# Patient Record
Sex: Male | Born: 1965
Health system: Southern US, Community
[De-identification: ages and names within clinical notes are randomized; demographics above are authoritative.]

## PROBLEM LIST (undated history)

## (undated) DIAGNOSIS — T7840XA Allergy, unspecified, initial encounter: Secondary | ICD-10-CM

## (undated) DIAGNOSIS — F17200 Nicotine dependence, unspecified, uncomplicated: Secondary | ICD-10-CM

## (undated) DIAGNOSIS — I10 Essential (primary) hypertension: Secondary | ICD-10-CM

## (undated) DIAGNOSIS — E785 Hyperlipidemia, unspecified: Secondary | ICD-10-CM

## (undated) DIAGNOSIS — K219 Gastro-esophageal reflux disease without esophagitis: Secondary | ICD-10-CM

## (undated) HISTORY — DX: Allergy, unspecified, initial encounter: T78.40XA

## (undated) HISTORY — DX: Hyperlipidemia, unspecified: E78.5

## (undated) HISTORY — DX: Gastro-esophageal reflux disease without esophagitis: K21.9

## (undated) HISTORY — DX: Essential (primary) hypertension: I10

## (undated) HISTORY — DX: Nicotine dependence, unspecified, uncomplicated: F17.200

---

## 2005-01-30 ENCOUNTER — Emergency Department (HOSPITAL_COMMUNITY): Admission: EM | Admit: 2005-01-30 | Discharge: 2005-01-30 | Payer: Self-pay | Admitting: Emergency Medicine

## 2006-03-10 ENCOUNTER — Ambulatory Visit: Payer: Self-pay | Admitting: Family Medicine

## 2006-07-19 ENCOUNTER — Ambulatory Visit: Payer: Self-pay | Admitting: Family Medicine

## 2007-03-23 ENCOUNTER — Ambulatory Visit: Payer: Self-pay | Admitting: Family Medicine

## 2007-03-27 ENCOUNTER — Ambulatory Visit: Payer: Self-pay | Admitting: Family Medicine

## 2008-04-09 ENCOUNTER — Ambulatory Visit: Payer: Self-pay | Admitting: Family Medicine

## 2008-11-27 ENCOUNTER — Ambulatory Visit: Payer: Self-pay | Admitting: Family Medicine

## 2009-04-30 ENCOUNTER — Ambulatory Visit: Payer: Self-pay | Admitting: Family Medicine

## 2010-05-05 ENCOUNTER — Ambulatory Visit: Payer: Self-pay | Admitting: Family Medicine

## 2010-06-04 ENCOUNTER — Ambulatory Visit: Payer: Self-pay | Admitting: Family Medicine

## 2010-11-05 ENCOUNTER — Encounter: Payer: Self-pay | Admitting: Family Medicine

## 2010-11-06 ENCOUNTER — Encounter: Payer: Self-pay | Admitting: Family Medicine

## 2010-11-06 ENCOUNTER — Ambulatory Visit (INDEPENDENT_AMBULATORY_CARE_PROVIDER_SITE_OTHER): Payer: 59 | Admitting: Family Medicine

## 2010-11-06 VITALS — BP 130/90 | HR 70 | Wt 170.0 lb

## 2010-11-06 DIAGNOSIS — K219 Gastro-esophageal reflux disease without esophagitis: Secondary | ICD-10-CM

## 2010-11-06 DIAGNOSIS — E785 Hyperlipidemia, unspecified: Secondary | ICD-10-CM

## 2010-11-06 LAB — LIPID PANEL
Cholesterol: 176 mg/dL (ref 0–200)
HDL: 39 mg/dL — ABNORMAL LOW (ref >39)
LDL Cholesterol: 73 mg/dL (ref 0–99)
Total CHOL/HDL Ratio: 4.5 Ratio
Triglycerides: 321 mg/dL — ABNORMAL HIGH (ref <150)
VLDL: 64 mg/dL — ABNORMAL HIGH (ref 0–40)

## 2010-11-06 NOTE — Progress Notes (Signed)
  Subjective:    Patient ID: Michael [name], male    DOB: 1965-07-29, 45 y.o.   MRN: [id number]  HPI he is here for recheck on his cholesterol. He is also having difficulty with reflux disease. He is taking Pepcid 10 mg. He does note that certain foods make this worse. Symptoms do not occur regularly. They're usually associated with spicy foods.   Review of Systems     Objective:   Physical Exam dirt and in no distress otherwise not examined        Assessment & Plan:  GERD. Commend he increase Pepcid to 2 pills per day and he is symptomatic and if further difficulty call me. Use Pepcid prior to eating anything he knows will cause symptoms

## 2010-11-06 NOTE — Patient Instructions (Signed)
Use to Pepcid prior TED anything you know will make you have indigestion. Continue on Lipitor. Keep working on quitting smoking.

## 2010-11-16 ENCOUNTER — Telehealth: Payer: Self-pay

## 2010-11-16 NOTE — Telephone Encounter (Signed)
Called pt tell him labs not so good going to send diet info in mail pt said ok meds called into [facility] [address]

## 2011-05-06 ENCOUNTER — Other Ambulatory Visit: Payer: Self-pay | Admitting: Family Medicine

## 2011-05-07 NOTE — Telephone Encounter (Signed)
Is this okay?

## 2011-05-07 NOTE — Telephone Encounter (Signed)
I gave him a one-month supply. He needs an appointment.

## 2011-05-07 NOTE — Telephone Encounter (Signed)
Left message to call back  

## 2011-05-07 NOTE — Telephone Encounter (Signed)
Pt notified and is coming in November 20th for physical, and refill on med

## 2011-05-18 ENCOUNTER — Encounter: Payer: Self-pay | Admitting: Family Medicine

## 2011-05-18 ENCOUNTER — Ambulatory Visit (INDEPENDENT_AMBULATORY_CARE_PROVIDER_SITE_OTHER): Payer: 59 | Admitting: Family Medicine

## 2011-05-18 DIAGNOSIS — K219 Gastro-esophageal reflux disease without esophagitis: Secondary | ICD-10-CM

## 2011-05-18 DIAGNOSIS — R209 Unspecified disturbances of skin sensation: Secondary | ICD-10-CM

## 2011-05-18 DIAGNOSIS — E785 Hyperlipidemia, unspecified: Secondary | ICD-10-CM

## 2011-05-18 DIAGNOSIS — Z0000 Encounter for general adult medical examination without abnormal findings: Secondary | ICD-10-CM

## 2011-05-18 DIAGNOSIS — R208 Other disturbances of skin sensation: Secondary | ICD-10-CM

## 2011-05-18 DIAGNOSIS — F17200 Nicotine dependence, unspecified, uncomplicated: Secondary | ICD-10-CM | POA: Insufficient documentation

## 2011-05-18 DIAGNOSIS — I10 Essential (primary) hypertension: Secondary | ICD-10-CM

## 2011-05-18 DIAGNOSIS — Z23 Encounter for immunization: Secondary | ICD-10-CM

## 2011-05-18 DIAGNOSIS — J301 Allergic rhinitis due to pollen: Secondary | ICD-10-CM

## 2011-05-18 LAB — POCT URINALYSIS DIPSTICK
Bilirubin, UA: NEGATIVE
Blood, UA: NEGATIVE
Glucose, UA: NEGATIVE
Ketones, UA: NEGATIVE
Leukocytes, UA: NEGATIVE
Nitrite, UA: NEGATIVE
Protein, UA: NEGATIVE
Spec Grav, UA: 1.015
Urobilinogen, UA: NEGATIVE
pH, UA: 5

## 2011-05-18 LAB — COMPREHENSIVE METABOLIC PANEL
ALT: 26 U/L (ref 0–53)
AST: 20 U/L (ref 0–37)
Albumin: 4.8 g/dL (ref 3.5–5.2)
Alkaline Phosphatase: 87 U/L (ref 39–117)
BUN: 15 mg/dL (ref 6–23)
CO2: 28 mEq/L (ref 19–32)
Calcium: 9.9 mg/dL (ref 8.4–10.5)
Chloride: 103 mEq/L (ref 96–112)
Creat: 0.92 mg/dL (ref 0.50–1.35)
Glucose, Bld: 86 mg/dL (ref 70–99)
Potassium: 4.2 mEq/L (ref 3.5–5.3)
Sodium: 139 mEq/L (ref 135–145)
Total Bilirubin: 1 mg/dL (ref 0.3–1.2)
Total Protein: 7.2 g/dL (ref 6.0–8.3)

## 2011-05-18 LAB — LIPID PANEL
Cholesterol: 151 mg/dL (ref 0–200)
HDL: 41 mg/dL (ref >39)
LDL Cholesterol: 51 mg/dL (ref 0–99)
Total CHOL/HDL Ratio: 3.7 Ratio
Triglycerides: 296 mg/dL — ABNORMAL HIGH (ref <150)
VLDL: 59 mg/dL — ABNORMAL HIGH (ref 0–40)

## 2011-05-18 LAB — POC HEMOCCULT BLD/STL (OFFICE/1-CARD/DIAGNOSTIC): Fecal Occult Blood, POC: NEGATIVE

## 2011-05-18 MED ORDER — BISOPROLOL-HYDROCHLOROTHIAZIDE 10-6.25 MG PO TABS: 1.0000 | ORAL_TABLET | Freq: Every day | ORAL | Status: DC

## 2011-05-18 MED ORDER — ATORVASTATIN CALCIUM 20 MG PO TABS: 20.0000 mg | ORAL_TABLET | Freq: Every day | ORAL | Status: DC

## 2011-05-18 NOTE — Patient Instructions (Signed)
We will let you know the results of the nerve study. Also keep track of your wrist position in relation to when you have the tingling sensation. We will work with you when you're ready to quit smoking

## 2011-05-18 NOTE — Progress Notes (Signed)
Subjective:    Patient ID: Michael [name], male    DOB: 04/25/1966, 45 y.o.   MRN: [id number]  HPI He is here for complete examination. He has had difficulty with bilateral wrist tingling. He relates this to using the mouse at work and also when he is using a drill. He states that it affects all fingers. He also complains of intermittent knee pain and a popping sensation. The pain goes away relatively quickly. No locking grinding or effusion. He also complains of intermittent dizziness. He presently is on blood pressure medication. He continues to smoke and is not interested in quitting at this time. Social and family history was reviewed. He is also noted some change in his vision and knows he needs glasses but at this time is not interested. His allergies are under good control. He uses Pepcid for his reflux. Continues on his Lipitor.   Review of Systems  Constitutional: Negative.   HENT: Negative.   Eyes: Positive for visual disturbance.  Respiratory: Negative.   Cardiovascular: Negative.   Gastrointestinal: Positive for nausea.  Genitourinary: Negative.   Skin: Negative.   Psychiatric/Behavioral: Negative.        Objective:   Physical Exam BP 132/92  Pulse 62  Ht 5\' 7"  (1.702 m)  Wt 168 lb (76.204 kg)  BMI 26.31 kg/m2  General Appearance:    Alert, cooperative, no distress, appears stated age  Head:    Normocephalic, without obvious abnormality, atraumatic  Eyes:    PERRL, conjunctiva/corneas clear, EOM's intact, fundi    benign  Ears:    Normal TM's and external ear canals  Nose:   Nares normal, mucosa normal, no drainage or sinus   tenderness  Throat:   Lips, mucosa, and tongue normal; teeth and gums normal  Neck:   Supple, no lymphadenopathy;  thyroid:  no   enlargement/tenderness/nodules; no carotid   bruit or JVD  Back:    Spine nontender, no curvature, ROM normal, no CVA     tenderness  Lungs:     Clear to auscultation bilaterally without wheezes, rales or     ronchi;  respirations unlabored  Chest Wall:    No tenderness or deformity   Heart:    Regular rate and rhythm, S1 and S2 normal, no murmur, rub   or gallop  Breast Exam:    No chest wall tenderness, masses or gynecomastia  Abdomen:     Soft, non-tender, nondistended, normoactive bowel sounds,    no masses, no hepatosplenomegaly  Genitalia:    Normal male external genitalia without lesions.  Testicles without masses.  No inguinal hernias.  Rectal:    Normal sphincter tone, no masses or tenderness; guaiac negative stool.  Prostate smooth, no nodules, not enlarged.  Extremities:   No clubbing, cyanosis or edema.negative Tinel and Phalen's tests of wrist  Pulses:   2+ and symmetric all extremities  Skin:   Skin color, texture, turgor normal, no rashes or lesions  Lymph nodes:   Cervical, supraclavicular, and axillary nodes normal  Neurologic:   CNII-XII intact, normal strength, sensation and gait; reflexes 2+ and symmetric throughout          Psych:   Normal mood, affect, hygiene and grooming.           Assessment & Plan:   1. Physical exam, annual  Visual acuity screening, POCT Urinalysis Dipstick, POCT Hemoccult (POC) Blood/Stool Test  2. Need for prophylactic vaccination and inoculation against influenza  Flu vaccine greater than or equal  to 3yo preservative free IM  3. Dysesthesia  Nerve conduction test  4. Current smoker    5. Hypertension    6. Hyperlipidemia LDL goal < 100    7. GERD (gastroesophageal reflux disease)    8. Allergic rhinitis due to pollen    his medications were renewed. I will work with him and he is ready to quit smoking. He'll continue on his Pepcid. Flu shot was given with discussion of possible side effects and benefits

## 2011-05-19 LAB — CBC WITH DIFFERENTIAL/PLATELET
Basophils Absolute: 0.1 10*3/uL (ref 0.0–0.1)
Basophils Relative: 1 % (ref 0–1)
Eosinophils Absolute: 0.2 10*3/uL (ref 0.0–0.7)
Eosinophils Relative: 3 % (ref 0–5)
HCT: 48.5 % (ref 39.0–52.0)
Hemoglobin: 16.3 g/dL (ref 13.0–17.0)
Lymphocytes Relative: 20 % (ref 12–46)
Lymphs Abs: 1.5 10*3/uL (ref 0.7–4.0)
MCH: 31.8 pg (ref 26.0–34.0)
MCHC: 33.6 g/dL (ref 30.0–36.0)
MCV: 94.5 fL (ref 78.0–100.0)
Monocytes Absolute: 0.4 10*3/uL (ref 0.1–1.0)
Monocytes Relative: 6 % (ref 3–12)
Neutro Abs: 5.1 10*3/uL (ref 1.7–7.7)
Neutrophils Relative %: 70 % (ref 43–77)
Platelets: 196 10*3/uL (ref 150–400)
RBC: 5.13 MIL/uL (ref 4.22–5.81)
RDW: 13.1 % (ref 11.5–15.5)
WBC: 7.2 10*3/uL (ref 4.0–10.5)

## 2011-08-20 ENCOUNTER — Encounter: Payer: Self-pay | Admitting: Medical

## 2011-08-20 ENCOUNTER — Ambulatory Visit (INDEPENDENT_AMBULATORY_CARE_PROVIDER_SITE_OTHER): Payer: 59 | Admitting: Medical

## 2011-08-20 VITALS — BP 130/80 | HR 60 | Temp 97.8°F | Resp 16 | Wt 166.0 lb

## 2011-08-20 DIAGNOSIS — R42 Dizziness and giddiness: Secondary | ICD-10-CM

## 2011-08-20 DIAGNOSIS — R5381 Other malaise: Secondary | ICD-10-CM

## 2011-08-20 DIAGNOSIS — R531 Weakness: Secondary | ICD-10-CM

## 2011-08-20 DIAGNOSIS — R0602 Shortness of breath: Secondary | ICD-10-CM

## 2011-08-20 DIAGNOSIS — R109 Unspecified abdominal pain: Secondary | ICD-10-CM

## 2011-08-20 DIAGNOSIS — R5383 Other fatigue: Secondary | ICD-10-CM

## 2011-08-20 LAB — BASIC METABOLIC PANEL
BUN: 23 mg/dL (ref 6–23)
CO2: 25 mEq/L (ref 19–32)
Calcium: 9.8 mg/dL (ref 8.4–10.5)
Chloride: 103 mEq/L (ref 96–112)
Creat: 0.98 mg/dL (ref 0.50–1.35)
Glucose, Bld: 90 mg/dL (ref 70–99)
Potassium: 4.1 mEq/L (ref 3.5–5.3)
Sodium: 136 mEq/L (ref 135–145)

## 2011-08-20 LAB — TSH: TSH: 1.965 u[IU]/mL (ref 0.350–4.500)

## 2011-08-20 MED ORDER — DEXLANSOPRAZOLE 60 MG PO CPDR: 60.0000 mg | DELAYED_RELEASE_CAPSULE | Freq: Every day | ORAL | Status: DC

## 2011-08-20 NOTE — Progress Notes (Signed)
Subjective:   HPI  Michael [name] is a 46 y.o. male who presents for multiple c/o.  Symptoms began yesterday morning, felt like he was having stomach pain.  After breakfast felt like he could not breath right.  Had some chest discomfort and shortness of breath lasted for 1 hour, and felt weak, tired, and dizzy for 3 hours. Had cold clammy feeling on his neck.  Denies heartburn, nausea, vomiting, constipation, no difficulty swallowing.  SOB was worse with activity though. Took Maalox and this helped with the fullness, was able to burp and relieve some pain.  Still has some dyspnea today, feels weak and out of breath.  Denies palpitations or sweats.   He drinks coffee, but no caffeine yesterday.  He does have hx/o HTN and hyperlipidemia.  Smokes 8-10 cigarettes daily.  He notes intermittent "woozy" feeling for the past 6 months.  He notes that the Bisoprolol/HCT was doubled last year, and thinks this may have something to do with these symptoms.  Gets weakness occasionally over the last 6 months.   No other aggravating or relieving factors.   He notes grandfather had MI and felt weak before his MI and he is worried about this.  No other family hx/o heart disease.    The following portions of the patient's history were reviewed and updated as appropriate: allergies, current medications, past family history, past medical history, past social history, past surgical history and problem list.  Past Medical History  Diagnosis Date  . Hyperlipidemia   . Allergy   . GERD (gastroesophageal reflux disease)   . Hypertension     No Known Allergies   Review of Systems Gen: no weight changes, no fever, chills, +sweats Skin: no rash Heent: negative Heart: no CP, palpitations, edema Lungs: +SOB, DOE, no orthopnea, PND GI: +abdominal pain, fullness, no NVD, no blood in stool GU: negative Neuro: dizzy, but no numbness, tingling, weakness      Objective:   Physical Exam  General appearance: alert, no  distress, WD/WN HEENT: normocephalic, sclerae anicteric, TMs pearly, nares patent, no discharge or erythema, pharynx normal Oral cavity: MMM, no lesions Neck: supple, no lymphadenopathy, no thyromegaly, no masses Heart: RRR, normal S1, S2, no murmurs Lungs: CTA bilaterally, no wheezes, rhonchi, or rales Abdomen: +bs, soft, mild epigastric tenderness, otherwise, non distended, no masses, no hepatomegaly, no splenomegaly Pulses: 2+ symmetric, upper and lower extremities, normal cap refill Ext: no edema, cyanosis, clubbing   Adult ECG Report  Indication: DOE, SOB  Rate: 56 bpm  Rhythm: sinus bradycardia, on beta blocker  QRS Axis: 47 degrees  PR Interval: 166 ms  QRS Duration: 90ms  QTc: 368 ms  Conduction Disturbances: none  Other Abnormalities: biphasic p wave V1, T wave depression III, flat T wave V1  Patient's cardiac risk factors are: dyslipidemia, hypertension, male gender and smoking/ tobacco exposure.  EKG comparison: 12/2004  Narrative Interpretation: sinus bradycardia, no significant change from 2006 EKG other than bradycardia  Chest xray:  Borderline cardiac silhouette enlargement, otherwise unremarkable CXR.  No mass, obvious CHF.  Will send for overead.   Assessment and Plan :     Encounter Diagnoses  Name Primary?  . SOB (shortness of breath) Yes  . Weakness   . Dizziness   . Fatigue   . Abdominal discomfort    Discussed his symptoms, risk factors for heart disease, and workup.  At this point etiology is unclear, and could be GERD related, cardiac related?  I will have him try  samples of Dexilant over the weekend, avoid GERD triggers, rest, no strenuous activity this weekend.  We will get labs today, send CXR for overread, and pending labs, will likely get cardiology consult for completeness and rule out cardiac origin.  Discussed signs/symptoms of acute coronary syndrome or worrisome symptoms that would prompt 911 call.  He understands and agrees with plan.  I will call  him tonight or tomorrow pending labs.

## 2011-08-21 LAB — CK TOTAL AND CKMB (NOT AT ARMC)
CK, MB: 0.8 ng/mL (ref 0.3–4.0)
Total CK: 89 U/L (ref 7–232)

## 2011-08-21 LAB — TROPONIN I: Troponin I: <0.01 ng/mL (ref <0.06)

## 2011-08-21 LAB — BRAIN NATRIURETIC PEPTIDE: Brain Natriuretic Peptide: 20.1 pg/mL (ref 0.0–100.0)

## 2011-08-27 ENCOUNTER — Telehealth: Payer: Self-pay | Admitting: Family Medicine

## 2011-08-27 ENCOUNTER — Encounter: Payer: Self-pay | Admitting: Medical

## 2011-08-27 NOTE — Telephone Encounter (Signed)
Message copied by Janeice Robinson on Fri Aug 27, 2011  9:14 AM ------      Message from: Aleen Campi, DAVID S      Created: Thu Aug 26, 2011 10:47 AM       CXR over read normal.  F/u as planned.

## 2011-08-27 NOTE — Telephone Encounter (Signed)
[facility] NOTIFYING THE PATIENT THAT HIS XRAY WAS NORMAL AND TO F/U WITH SHANE PA-C. CLS

## 2012-05-03 ENCOUNTER — Telehealth: Payer: Self-pay | Admitting: Family Medicine

## 2012-05-03 MED ORDER — ATORVASTATIN CALCIUM 20 MG PO TABS: 20.0000 mg | ORAL_TABLET | Freq: Every day | ORAL | Status: DC

## 2012-05-03 NOTE — Telephone Encounter (Signed)
Lipitor called in.

## 2012-05-10 ENCOUNTER — Encounter: Payer: Self-pay | Admitting: Internal Medicine

## 2012-05-22 ENCOUNTER — Encounter: Payer: Self-pay | Admitting: Family Medicine

## 2012-05-22 ENCOUNTER — Ambulatory Visit (INDEPENDENT_AMBULATORY_CARE_PROVIDER_SITE_OTHER): Payer: 59 | Admitting: Family Medicine

## 2012-05-22 VITALS — BP 126/80 | HR 74 | Ht 67.0 in | Wt 165.0 lb

## 2012-05-22 DIAGNOSIS — K219 Gastro-esophageal reflux disease without esophagitis: Secondary | ICD-10-CM

## 2012-05-22 DIAGNOSIS — F17200 Nicotine dependence, unspecified, uncomplicated: Secondary | ICD-10-CM

## 2012-05-22 DIAGNOSIS — Z0000 Encounter for general adult medical examination without abnormal findings: Secondary | ICD-10-CM

## 2012-05-22 DIAGNOSIS — I10 Essential (primary) hypertension: Secondary | ICD-10-CM

## 2012-05-22 DIAGNOSIS — Z23 Encounter for immunization: Secondary | ICD-10-CM

## 2012-05-22 DIAGNOSIS — E785 Hyperlipidemia, unspecified: Secondary | ICD-10-CM

## 2012-05-22 DIAGNOSIS — J301 Allergic rhinitis due to pollen: Secondary | ICD-10-CM

## 2012-05-22 LAB — POCT URINALYSIS DIPSTICK
Bilirubin, UA: NEGATIVE
Blood, UA: NEGATIVE
Glucose, UA: NEGATIVE
Ketones, UA: NEGATIVE
Leukocytes, UA: NEGATIVE
Nitrite, UA: NEGATIVE
Protein, UA: NEGATIVE
Spec Grav, UA: 1.02
Urobilinogen, UA: NEGATIVE
pH, UA: 5

## 2012-05-22 MED ORDER — BISOPROLOL-HYDROCHLOROTHIAZIDE 10-6.25 MG PO TABS: 1.0000 | ORAL_TABLET | Freq: Every day | ORAL | Status: DC

## 2012-05-22 MED ORDER — ATORVASTATIN CALCIUM 20 MG PO TABS: 20.0000 mg | ORAL_TABLET | Freq: Every day | ORAL | Status: DC

## 2012-05-22 NOTE — Progress Notes (Signed)
Subjective:    Patient ID: Michael [name], male    DOB: [birth year], 46 y.o.   MRN: [id number]  HPI Here for complete examination. He does have various complaints but none of any major significance. He continues to smoke and is not ready to quit. He continues on his blood pressure medication, reflux meds and statin and is having no difficulty with these. His work is going well. Home life is somewhat stressful but he seems to be handling this fairly well. Social and family history were reviewed.   Review of Systems  Constitutional: Negative.   HENT: Negative.   Eyes: Negative.   Respiratory: Negative.   Cardiovascular: Negative.   Gastrointestinal: Negative.   Genitourinary: Negative.   Musculoskeletal: Negative.   Skin: Negative.   Neurological: Negative.   Hematological: Negative.   Psychiatric/Behavioral: Negative.        Objective:   Physical Exam BP 126/80  Pulse 74  Ht 5\' 7"  (1.702 m)  Wt 165 lb (74.844 kg)  BMI 25.84 kg/m2  SpO2 99%  General Appearance:    Alert, cooperative, no distress, appears stated age  Head:    Normocephalic, without obvious abnormality, atraumatic  Eyes:    PERRL, conjunctiva/corneas clear, EOM's intact, fundi    benign  Ears:    Normal TM's and external ear canals  Nose:   Nares normal, mucosa normal, no drainage or sinus   tenderness  Throat:   Lips, mucosa, and tongue normal; teeth and gums normal  Neck:   Supple, no lymphadenopathy;  thyroid:  no   enlargement/tenderness/nodules; no carotid   bruit or JVD  Back:    Spine nontender, no curvature, ROM normal, no CVA     tenderness  Lungs:     Clear to auscultation bilaterally without wheezes, rales or     ronchi; respirations unlabored  Chest Wall:    No tenderness or deformity   Heart:    Regular rate and rhythm, S1 and S2 normal, no murmur, rub   or gallop  Breast Exam:    No chest wall tenderness, masses or gynecomastia  Abdomen:     Soft, non-tender, nondistended, normoactive bowel  sounds,    no masses, no hepatosplenomegaly  Genitalia:    Normal male external genitalia without lesions.  Testicles without masses.  No inguinal hernias.  Rectal:    Normal sphincter tone, no masses or tenderness; guaiac negative stool.  Prostate smooth, no nodules, not enlarged.  Extremities:   No clubbing, cyanosis or edema  Pulses:   2+ and symmetric all extremities  Skin:   Skin color, texture, turgor normal, no rashes or lesions  Lymph nodes:   Cervical, supraclavicular, and axillary nodes normal  Neurologic:   CNII-XII intact, normal strength, sensation and gait; reflexes 2+ and symmetric throughout          Psych:   Normal mood, affect, hygiene and grooming.          Assessment & Plan:   1. Routine general medical examination at a health care facility  POCT Urinalysis Dipstick, Lipid panel, CBC with Differential, Comprehensive metabolic panel  2. Need for prophylactic vaccination and inoculation against influenza  Flu vaccine greater than or equal to 3yo preservative free IM  3. Smoker    4. Hypertension  bisoprolol-hydrochlorothiazide (ZIAC) 10-6.25 MG per tablet  5. Hyperlipidemia LDL goal < 100  atorvastatin (LIPITOR) 20 MG tablet, Lipid panel  6. GERD (gastroesophageal reflux disease)    7. Allergic rhinitis due to pollen  flu shot given with discussion of risks and benefits I encouraged him to take care of himself and when he is ready to quit, call me.

## 2012-05-23 LAB — CBC WITH DIFFERENTIAL/PLATELET
Basophils Absolute: 0.1 10*3/uL (ref 0.0–0.1)
Basophils Relative: 1 % (ref 0–1)
Eosinophils Absolute: 0.2 10*3/uL (ref 0.0–0.7)
Eosinophils Relative: 2 % (ref 0–5)
HCT: 47.2 % (ref 39.0–52.0)
Hemoglobin: 16.2 g/dL (ref 13.0–17.0)
Lymphocytes Relative: 24 % (ref 12–46)
Lymphs Abs: 1.8 10*3/uL (ref 0.7–4.0)
MCH: 31.3 pg (ref 26.0–34.0)
MCHC: 34.3 g/dL (ref 30.0–36.0)
MCV: 91.1 fL (ref 78.0–100.0)
Monocytes Absolute: 0.4 10*3/uL (ref 0.1–1.0)
Monocytes Relative: 5 % (ref 3–12)
Neutro Abs: 5 10*3/uL (ref 1.7–7.7)
Neutrophils Relative %: 68 % (ref 43–77)
Platelets: 192 10*3/uL (ref 150–400)
RBC: 5.18 MIL/uL (ref 4.22–5.81)
RDW: 12.9 % (ref 11.5–15.5)
WBC: 7.4 10*3/uL (ref 4.0–10.5)

## 2012-05-23 LAB — COMPREHENSIVE METABOLIC PANEL
ALT: 25 U/L (ref 0–53)
AST: 20 U/L (ref 0–37)
Albumin: 5 g/dL (ref 3.5–5.2)
Alkaline Phosphatase: 79 U/L (ref 39–117)
BUN: 20 mg/dL (ref 6–23)
CO2: 31 mEq/L (ref 19–32)
Calcium: 10 mg/dL (ref 8.4–10.5)
Chloride: 102 mEq/L (ref 96–112)
Creat: 0.91 mg/dL (ref 0.50–1.35)
Glucose, Bld: 88 mg/dL (ref 70–99)
Potassium: 4.5 mEq/L (ref 3.5–5.3)
Sodium: 139 mEq/L (ref 135–145)
Total Bilirubin: 0.7 mg/dL (ref 0.3–1.2)
Total Protein: 7.3 g/dL (ref 6.0–8.3)

## 2012-05-23 LAB — LIPID PANEL
Cholesterol: 136 mg/dL (ref 0–200)
HDL: 42 mg/dL (ref >39)
LDL Cholesterol: 48 mg/dL (ref 0–99)
Total CHOL/HDL Ratio: 3.2 Ratio
Triglycerides: 230 mg/dL — ABNORMAL HIGH (ref <150)
VLDL: 46 mg/dL — ABNORMAL HIGH (ref 0–40)

## 2012-05-23 NOTE — Progress Notes (Signed)
Quick Note:  Called pt cell # and home # left message labs okay continue present meds ______

## 2012-06-01 ENCOUNTER — Ambulatory Visit (INDEPENDENT_AMBULATORY_CARE_PROVIDER_SITE_OTHER): Payer: BC Managed Care – PPO | Admitting: Family Medicine

## 2012-06-01 ENCOUNTER — Encounter: Payer: Self-pay | Admitting: Family Medicine

## 2012-06-01 VITALS — BP 108/80 | HR 60 | Temp 98.2°F | Resp 16 | Wt 166.0 lb

## 2012-06-01 DIAGNOSIS — M461 Sacroiliitis, not elsewhere classified: Secondary | ICD-10-CM

## 2012-06-01 NOTE — Progress Notes (Signed)
  Subjective:    Patient ID: Michael [name], male    DOB: 07/23/1965, 46 y.o.   MRN: [id number]  HPI He noted the onset of back pain on November 25. No history of injury,numbness,tingling,weakness. It has interfered with his ability to lift items at work.  Review of Systems     Objective:   Physical Exam Alert and in no distress. Slight tenderness to palpation in the left SI joint area. Pearlean Brownie testing is positive. Stork test was negative. Negative straight leg raising with normal DTRs.      Assessment & Plan:   1. Sacroiliitis    recommend heat, stretching, anti-inflammatory of choice. Discussed proper lifting technique with him. Call if further problems.

## 2012-06-01 NOTE — Patient Instructions (Addendum)
Heat for 20 minutes 3 times per day. After you are done with that then do some stretching. Knees to chest, one side than the other than both and then rotate. Proper lifting includes keeping your back vertical If not better in a couple weeks come on back

## 2012-07-10 ENCOUNTER — Other Ambulatory Visit: Payer: Self-pay | Admitting: Family Medicine

## 2012-08-15 ENCOUNTER — Ambulatory Visit (INDEPENDENT_AMBULATORY_CARE_PROVIDER_SITE_OTHER): Payer: BC Managed Care – PPO | Admitting: Family Medicine

## 2012-08-15 ENCOUNTER — Encounter: Payer: Self-pay | Admitting: Family Medicine

## 2012-08-15 VITALS — BP 130/86 | HR 70 | Wt 168.0 lb

## 2012-08-15 DIAGNOSIS — M25519 Pain in unspecified shoulder: Secondary | ICD-10-CM

## 2012-08-15 DIAGNOSIS — M25512 Pain in left shoulder: Secondary | ICD-10-CM

## 2012-08-15 NOTE — Patient Instructions (Addendum)
Heat for 20 minutes 3 times per day. Ibuprofen 800 mg 3 times per day. Pay attention to what things make your back and shoulder better or worse. Come back in a week if you're still having trouble we'll reevaluate

## 2012-08-15 NOTE — Progress Notes (Signed)
  Subjective:    Patient ID: Fayrene Fearing A Hubers, male    DOB: 03/08/1966, 47 y.o.   MRN: [id number]  HPI  Approximately 3 weeks ago he noted some left shoulder pain in the subscapular area. The next day or so he then noted pain going down his arm to his elbow. He notes that neck motion does make the shoulder area worse but does not radiate down his arm. No numbness, tingling or weakness. He has been using heat on the area as many as 20 times per day as well as 400 mg ibuprofen every 6 hours. He also complains of a two-day history of left anterior chest soreness but is made worse with physical activity   Review of Systems     Objective:   Physical Exam Alert and in no distress. Left shoulder exam shows full motion without pain. No laxity noted. Normal sensation and strength. DTRs normal. Full motion of the neck. Posterior flexion of the neck did cause some scapular discomfort. No tenderness to palpation in the scapular area with normal motion.       Assessment & Plan:  Left shoulder pain I will treat conservatively with heat for 20 minutes 3 times per day. Also recommend ibuprofen 800 mg 3 times a day. He is to keep track of symptoms that make this better and worse and return here for reevaluation if no improvement.

## 2012-10-05 ENCOUNTER — Ambulatory Visit (INDEPENDENT_AMBULATORY_CARE_PROVIDER_SITE_OTHER): Payer: BC Managed Care – PPO | Admitting: Family Medicine

## 2012-10-05 ENCOUNTER — Ambulatory Visit
Admission: RE | Admit: 2012-10-05 | Discharge: 2012-10-05 | Disposition: A | Discharge status: Home / Self Care | Payer: BC Managed Care – PPO | Source: Ambulatory Visit | Attending: Family Medicine | Admitting: Family Medicine

## 2012-10-05 ENCOUNTER — Encounter: Payer: Self-pay | Admitting: Family Medicine

## 2012-10-05 VITALS — BP 130/80 | HR 62 | Wt 170.0 lb

## 2012-10-05 DIAGNOSIS — M25512 Pain in left shoulder: Secondary | ICD-10-CM

## 2012-10-05 DIAGNOSIS — M25519 Pain in unspecified shoulder: Secondary | ICD-10-CM

## 2012-10-05 DIAGNOSIS — R292 Abnormal reflex: Secondary | ICD-10-CM

## 2012-10-05 NOTE — Progress Notes (Signed)
  Subjective:    Patient ID: Michael [name], male    DOB: 30-Mar-1966, 47 y.o.   MRN: [id number]  HPI He is here for recheck. He states that after his last visit, the heat and anti-inflammatory with the symptoms however now for the last week he has noted some upper back discomfort when he gets up and morning that gets worse as the day goes on. The pain progresses into the shoulder and then down into the elbow but no particular activity makes this worse. He also notes that when he leans forward, he will get a tingling sensation in his hand but he is not specific on which part.   Review of Systems     Objective:   Physical Exam Alert and in no distress. Left lateral neck motion did cause discomfort into his shoulder. Sensation is normal. Questionable decreased reflex and triceps on the right with weakness of the right triceps muscle. A subtenon reflex was slightly diminished Sensation normal. Strength is normal in his hands.       Assessment & Plan:  Left shoulder pain - Plan: DG Cervical Spine Complete  Triceps reflex reduced - Plan: DG Cervical Spine Complete I will start with a C-spine x-ray first and then proceed with MRI especially with weak triceps strength and reflex.

## 2012-10-06 ENCOUNTER — Other Ambulatory Visit: Payer: Self-pay | Admitting: Internal Medicine

## 2012-10-06 DIAGNOSIS — M25512 Pain in left shoulder: Secondary | ICD-10-CM

## 2012-10-06 DIAGNOSIS — R292 Abnormal reflex: Secondary | ICD-10-CM

## 2012-10-09 ENCOUNTER — Other Ambulatory Visit: Payer: Self-pay | Admitting: Family Medicine

## 2012-10-09 DIAGNOSIS — Z139 Encounter for screening, unspecified: Secondary | ICD-10-CM

## 2012-10-12 ENCOUNTER — Ambulatory Visit
Admission: RE | Admit: 2012-10-12 | Discharge: 2012-10-12 | Disposition: A | Discharge status: Home / Self Care | Payer: BC Managed Care – PPO | Source: Ambulatory Visit | Attending: Family Medicine | Admitting: Family Medicine

## 2012-10-12 ENCOUNTER — Other Ambulatory Visit: Payer: BC Managed Care – PPO

## 2012-10-12 DIAGNOSIS — M25512 Pain in left shoulder: Secondary | ICD-10-CM

## 2012-10-12 DIAGNOSIS — Z139 Encounter for screening, unspecified: Secondary | ICD-10-CM

## 2012-10-12 DIAGNOSIS — R292 Abnormal reflex: Secondary | ICD-10-CM

## 2012-10-16 NOTE — Progress Notes (Signed)
Quick Note:  PATIENT INFORMED AND FAXED TO [facility] ______

## 2012-10-18 ENCOUNTER — Telehealth: Payer: Self-pay | Admitting: Family Medicine

## 2012-10-19 NOTE — Telephone Encounter (Signed)
CALLED [facility] THEY SAID THEY WERE CALLING HIM TODAY

## 2012-11-02 HISTORY — PX: CERVICAL FUSION: SHX112

## 2013-05-03 ENCOUNTER — Other Ambulatory Visit: Payer: Self-pay

## 2013-05-22 ENCOUNTER — Encounter: Payer: Self-pay | Admitting: Gastroenterology

## 2013-05-22 ENCOUNTER — Ambulatory Visit (INDEPENDENT_AMBULATORY_CARE_PROVIDER_SITE_OTHER): Payer: BC Managed Care – PPO | Admitting: Family Medicine

## 2013-05-22 ENCOUNTER — Encounter: Payer: Self-pay | Admitting: Family Medicine

## 2013-05-22 VITALS — BP 122/80 | HR 74 | Wt 180.0 lb

## 2013-05-22 DIAGNOSIS — K219 Gastro-esophageal reflux disease without esophagitis: Secondary | ICD-10-CM

## 2013-05-22 DIAGNOSIS — L989 Disorder of the skin and subcutaneous tissue, unspecified: Secondary | ICD-10-CM

## 2013-05-22 DIAGNOSIS — E785 Hyperlipidemia, unspecified: Secondary | ICD-10-CM

## 2013-05-22 DIAGNOSIS — I10 Essential (primary) hypertension: Secondary | ICD-10-CM

## 2013-05-22 DIAGNOSIS — Z79899 Other long term (current) drug therapy: Secondary | ICD-10-CM

## 2013-05-22 DIAGNOSIS — K921 Melena: Secondary | ICD-10-CM

## 2013-05-22 DIAGNOSIS — Z23 Encounter for immunization: Secondary | ICD-10-CM

## 2013-05-22 DIAGNOSIS — F17200 Nicotine dependence, unspecified, uncomplicated: Secondary | ICD-10-CM

## 2013-05-22 DIAGNOSIS — J301 Allergic rhinitis due to pollen: Secondary | ICD-10-CM

## 2013-05-22 LAB — CBC WITH DIFFERENTIAL/PLATELET
Basophils Absolute: 0.1 10*3/uL (ref 0.0–0.1)
Basophils Relative: 1 % (ref 0–1)
Eosinophils Absolute: 0.2 10*3/uL (ref 0.0–0.7)
Eosinophils Relative: 3 % (ref 0–5)
HCT: 45.5 % (ref 39.0–52.0)
Hemoglobin: 16.2 g/dL (ref 13.0–17.0)
Lymphocytes Relative: 21 % (ref 12–46)
Lymphs Abs: 1.6 10*3/uL (ref 0.7–4.0)
MCH: 31.9 pg (ref 26.0–34.0)
MCHC: 35.6 g/dL (ref 30.0–36.0)
MCV: 89.6 fL (ref 78.0–100.0)
Monocytes Absolute: 0.4 10*3/uL (ref 0.1–1.0)
Monocytes Relative: 6 % (ref 3–12)
Neutro Abs: 5 10*3/uL (ref 1.7–7.7)
Neutrophils Relative %: 69 % (ref 43–77)
Platelets: 194 10*3/uL (ref 150–400)
RBC: 5.08 MIL/uL (ref 4.22–5.81)
RDW: 13.7 % (ref 11.5–15.5)
WBC: 7.2 10*3/uL (ref 4.0–10.5)

## 2013-05-22 MED ORDER — ATORVASTATIN CALCIUM 20 MG PO TABS: ORAL_TABLET | ORAL | Status: DC

## 2013-05-22 MED ORDER — BISOPROLOL-HYDROCHLOROTHIAZIDE 10-6.25 MG PO TABS: 1.0000 | ORAL_TABLET | Freq: Every day | ORAL | Status: DC

## 2013-05-22 NOTE — Progress Notes (Signed)
Subjective:    Patient ID: Michael [name], male    DOB: [birth year], 47 y.o.   MRN: [id number]  HPI He is here for complete examination. He does have hypertension as well as hyperlipidemia. He continues on medications for that. He also is had some difficulty with reflux symptoms and does use Pepcid as well as Maalox on an intermittent basis. He does continue to smoke and at this point is not ready to quit. His allergies seem to be under good control and cluster very little difficulty. He does have a lesion on his back but he would like me to look at. His work and home life are going well. He has no other concerns or complaints. Family and social history were reviewed.   Review of Systems Negative except as above    Objective:   Physical Exam BP 122/80  Pulse 74  Wt 180 lb (81.647 kg)  General Appearance:    Alert, cooperative, no distress, appears stated age  Head:    Normocephalic, without obvious abnormality, atraumatic  Eyes:    PERRL, conjunctiva/corneas clear, EOM's intact, fundi    benign  Ears:    Normal TM's and external ear canals  Nose:   Nares normal, mucosa normal, no drainage or sinus   tenderness  Throat:   Lips, mucosa, and tongue normal; teeth and gums normal  Neck:   Supple, no lymphadenopathy;  thyroid:  no   enlargement/tenderness/nodules; no carotid   bruit or JVD  Back:    Spine nontender, no curvature, ROM normal, no CVA     tenderness  Lungs:     Clear to auscultation bilaterally without wheezes, rales or     ronchi; respirations unlabored  Chest Wall:    No tenderness or deformity   Heart:    Regular rate and rhythm, S1 and S2 normal, no murmur, rub   or gallop  Breast Exam:    No chest wall tenderness, masses or gynecomastia  Abdomen:     Soft, non-tender, nondistended, normoactive bowel sounds,    no masses, no hepatosplenomegaly  Genitalia:   deferred   Rectal:    Normal sphincter tone, no masses or tenderness; guaiac positive stool.  Prostate smooth, no  nodules, not enlarged.  Extremities:   No clubbing, cyanosis or edema  Pulses:   2+ and symmetric all extremities  Skin:   Skin color, texture, turgor normal, no rashes; one cm irregularly pigmented lesion noted on the left upper back area.   Lymph nodes:   Cervical, supraclavicular, and axillary nodes normal  Neurologic:   CNII-XII intact, normal strength, sensation and gait; reflexes 2+ and symmetric throughout          Psych:   Normal mood, affect, hygiene and grooming.          Assessment & Plan:  Need for prophylactic vaccination and inoculation against influenza - Plan: Flu Vaccine QUAD 36+ mos PF IM (Fluarix)  Smoker  Hypertension - Plan: CBC with Differential, Comprehensive metabolic panel, bisoprolol-hydrochlorothiazide (ZIAC) 10-6.25 MG per tablet  Hyperlipidemia LDL goal < 100 - Plan: Lipid panel, atorvastatin (LIPITOR) 20 MG tablet  GERD (gastroesophageal reflux disease)  Allergic rhinitis due to pollen  Back skin lesion  Encounter for long-term (current) use of other medications - Plan: CBC with Differential, Comprehensive metabolic panel, Lipid panel, Hemoccult - 1 Card (office)  Blood in stool - Plan: Ambulatory referral to Gastroenterology  he is to return here for a punch biopsy on the back lesion. Continue  on his present dosing of Pepcid. He will continue on his blood pressure medication as well as statin. At this point no further intervention concerning his smoking. Allergies are under good control in no intervention needed there. Flu shot with risks and benefits discussed.

## 2013-05-23 LAB — COMPREHENSIVE METABOLIC PANEL
ALT: 38 U/L (ref 0–53)
AST: 27 U/L (ref 0–37)
Albumin: 4.6 g/dL (ref 3.5–5.2)
Alkaline Phosphatase: 101 U/L (ref 39–117)
BUN: 16 mg/dL (ref 6–23)
CO2: 27 mEq/L (ref 19–32)
Calcium: 9.5 mg/dL (ref 8.4–10.5)
Chloride: 101 mEq/L (ref 96–112)
Creat: 1 mg/dL (ref 0.50–1.35)
Glucose, Bld: 86 mg/dL (ref 70–99)
Potassium: 4.3 mEq/L (ref 3.5–5.3)
Sodium: 139 mEq/L (ref 135–145)
Total Bilirubin: 0.8 mg/dL (ref 0.3–1.2)
Total Protein: 7.4 g/dL (ref 6.0–8.3)

## 2013-05-23 LAB — LIPID PANEL
Cholesterol: 162 mg/dL (ref 0–200)
HDL: 42 mg/dL (ref >39)
LDL Cholesterol: 48 mg/dL (ref 0–99)
Total CHOL/HDL Ratio: 3.9 Ratio
Triglycerides: 362 mg/dL — ABNORMAL HIGH (ref <150)
VLDL: 72 mg/dL — ABNORMAL HIGH (ref 0–40)

## 2013-05-23 NOTE — Progress Notes (Signed)
Quick Note:  MAILED PT LETTER OF LABS AND DIET INFO ______ 

## 2013-05-29 ENCOUNTER — Ambulatory Visit: Payer: BC Managed Care – PPO | Admitting: Family Medicine

## 2013-06-05 ENCOUNTER — Ambulatory Visit (INDEPENDENT_AMBULATORY_CARE_PROVIDER_SITE_OTHER): Payer: 59 | Admitting: Family Medicine

## 2013-06-05 DIAGNOSIS — L989 Disorder of the skin and subcutaneous tissue, unspecified: Secondary | ICD-10-CM

## 2013-06-05 NOTE — Progress Notes (Signed)
   Subjective:    Patient ID: Michael [name], male    DOB: 08/23/1965, 47 y.o.   MRN: [id number]  HPI He has a lesion on his left shoulder that he states has grown.   Review of Systems     Objective:   Physical Exam 0.5 mm slightly raised pigmented lesion is noted in the left upper shoulder. There is slight irregularity in the pigmentation.       Assessment & Plan:  Skin lesion of back  the lesion was injected with Xylocaine and epinephrine. A 2 mm punch biopsy was taken without difficulty. A bandage was applied.

## 2013-06-18 ENCOUNTER — Ambulatory Visit (INDEPENDENT_AMBULATORY_CARE_PROVIDER_SITE_OTHER): Payer: 59 | Admitting: Gastroenterology

## 2013-06-18 ENCOUNTER — Encounter: Payer: Self-pay | Admitting: Gastroenterology

## 2013-06-18 VITALS — BP 122/86 | HR 80 | Ht 65.75 in | Wt 182.4 lb

## 2013-06-18 DIAGNOSIS — R195 Other fecal abnormalities: Secondary | ICD-10-CM

## 2013-06-18 DIAGNOSIS — K219 Gastro-esophageal reflux disease without esophagitis: Secondary | ICD-10-CM

## 2013-06-18 MED ORDER — PEG-KCL-NACL-NASULF-NA ASC-C 100 G PO SOLR: 1.0000 | Freq: Once | ORAL | Status: DC

## 2013-06-18 NOTE — Progress Notes (Signed)
.     History of Present Illness: This is a 47 year old male who was recently found to have Hemoccult positive stool on rectal exam by Dr. Susann Givens. He relates a history of frequent heartburn symptoms for several years and he takes Maalox or Pepcid as needed. He generally has reflux symptoms for several days in a row about every other week. He has had intermittent difficulties with hemorrhoidal bleeding and swelling. The last time this happened was about 2 months ago. Denies weight loss, abdominal pain, constipation, diarrhea, change in stool caliber, melena, nausea, vomiting, dysphagia, chest pain.  Review of Systems: Pertinent positive and negative review of systems were noted in the above HPI section. All other review of systems were otherwise negative.  Current Medications, Allergies, Past Medical History, Past Surgical History, Family History and Social History were reviewed in [employer] record.  Physical Exam: General: Well developed , well nourished, no acute distress Head: Normocephalic and atraumatic Eyes:  sclerae anicteric, EOMI Ears: Normal auditory acuity Mouth: No deformity or lesions Neck: Supple, no masses or thyromegaly Lungs: Clear throughout to auscultation Heart: Regular rate and rhythm; no murmurs, rubs or bruits Abdomen: Soft, non tender and non distended. No masses, hepatosplenomegaly or hernias noted. Normal Bowel sounds Rectal: Recent exam by Dr. Susann Givens showed no lesions and Hemoccult positive stool. Musculoskeletal: Symmetrical with no gross deformities  Skin: No lesions on visible extremities Pulses:  Normal pulses noted Extremities: No clubbing, cyanosis, edema or deformities noted Neurological: Alert oriented x 4, grossly nonfocal Cervical Nodes:  No significant cervical adenopathy Inguinal Nodes: No significant inguinal adenopathy Psychological:  Alert and cooperative. Normal mood and affect  Assessment and Recommendations:  1.  Hemoccult positive stool. History of small-volume hematochezia likely from a benign anorectal source. Rule out colorectal neoplasms. Schedule colonoscopy. The risks, benefits, and alternatives to colonoscopy with possible biopsy and possible polypectomy were discussed with the patient and they consent to proceed.   2. GERD with frequent symptoms. Rule out erosive esophagitis and Barrett's esophagus. Maintain standard antireflux measures. Further evaluate with endoscopy. The risks, benefits, and alternatives to endoscopy with possible biopsy and possible dilation were discussed with the patient and they consent to proceed.

## 2013-06-18 NOTE — Patient Instructions (Addendum)
You have been given a separate informational sheet regarding your tobacco use, the importance of quitting and local resources to help you quit.  You have been scheduled for an endoscopy and colonoscopy with propofol. Please follow the written instructions given to you at your visit today. Please pick up your prep at the pharmacy within the next 1-3 days. If you use inhalers (even only as needed), please bring them with you on the day of your procedure.  Patient advised to avoid spicy, acidic, citrus, chocolate, mints, fruit and fruit juices.  Limit the intake of caffeine, alcohol and Soda.  Don't exercise too soon after eating.  Don't lie down within 3-4 hours of eating.  Elevate the head of your bed.  Thank you for choosing me and [facility].  Venita Lick. Pleas Koch., MD., Clementeen Graham

## 2013-06-19 ENCOUNTER — Encounter: Payer: Self-pay | Admitting: Gastroenterology

## 2013-08-15 ENCOUNTER — Telehealth: Payer: Self-pay | Admitting: Gastroenterology

## 2013-08-15 NOTE — Telephone Encounter (Signed)
Given weather will not charge this time.

## 2013-08-16 ENCOUNTER — Telehealth: Payer: Self-pay | Admitting: Family Medicine

## 2013-08-16 MED ORDER — OSELTAMIVIR PHOSPHATE 75 MG PO CAPS: 75.0000 mg | ORAL_CAPSULE | Freq: Every day | ORAL | Status: DC

## 2013-08-16 NOTE — Telephone Encounter (Signed)
Pt called said his 47 year old son has the flu and the pediatrician said for Michael [name] to call his pcp and get started on Tamiflu.  Pt wants it sent to [facility] on [address]  He is not having sx just wants it proactively.  Pt ph [phone]

## 2013-08-16 NOTE — Telephone Encounter (Signed)
His son apparently has the flu. He would like Tamiflu called in. I explained daily use of this for the next 10 days.

## 2013-08-17 ENCOUNTER — Encounter: Payer: 59 | Admitting: Gastroenterology

## 2013-09-14 ENCOUNTER — Encounter: Payer: 59 | Admitting: Gastroenterology

## 2013-10-24 ENCOUNTER — Ambulatory Visit (AMBULATORY_SURGERY_CENTER): Payer: 59 | Admitting: Gastroenterology

## 2013-10-24 ENCOUNTER — Encounter: Payer: Self-pay | Admitting: Gastroenterology

## 2013-10-24 VITALS — BP 121/76 | HR 65 | Temp 96.9°F | Resp 15 | Ht 65.0 in | Wt 182.0 lb

## 2013-10-24 DIAGNOSIS — K219 Gastro-esophageal reflux disease without esophagitis: Secondary | ICD-10-CM

## 2013-10-24 DIAGNOSIS — R195 Other fecal abnormalities: Secondary | ICD-10-CM

## 2013-10-24 DIAGNOSIS — D126 Benign neoplasm of colon, unspecified: Secondary | ICD-10-CM

## 2013-10-24 MED ORDER — SODIUM CHLORIDE 0.9 % IV SOLN: 500.0000 mL | INTRAVENOUS | Status: DC

## 2013-10-24 MED ORDER — OMEPRAZOLE 40 MG PO CPDR: 40.0000 mg | DELAYED_RELEASE_CAPSULE | Freq: Every day | ORAL | Status: DC

## 2013-10-24 NOTE — Progress Notes (Signed)
Called to room to assist during endoscopic procedure.  Patient ID and intended procedure confirmed with present staff. Received instructions for my participation in the procedure from the performing physician.  

## 2013-10-24 NOTE — Patient Instructions (Signed)
YOU HAD AN ENDOSCOPIC PROCEDURE TODAY AT THE [facility]: Refer to the procedure report that was given to you for any specific questions about what was found during the examination.  If the procedure report does not answer your questions, please call your gastroenterologist to clarify.  If you requested that your care partner not be given the details of your procedure findings, then the procedure report has been included in a sealed envelope for you to review at your convenience later.  YOU SHOULD EXPECT: Some feelings of bloating in the abdomen. Passage of more gas than usual.  Walking can help get rid of the air that was put into your GI tract during the procedure and reduce the bloating. If you had a lower endoscopy (such as a colonoscopy or flexible sigmoidoscopy) you may notice spotting of blood in your stool or on the toilet paper. If you underwent a bowel prep for your procedure, then you may not have a normal bowel movement for a few days.  DIET: Your first meal following the procedure should be a light meal and then it is ok to progress to your normal diet.  A half-sandwich or bowl of soup is an example of a good first meal.  Heavy or fried foods are harder to digest and may make you feel nauseous or bloated.  Likewise meals heavy in dairy and vegetables can cause extra gas to form and this can also increase the bloating.  Drink plenty of fluids but you should avoid alcoholic beverages for 24 hours.  ACTIVITY: Your care partner should take you home directly after the procedure.  You should plan to take it easy, moving slowly for the rest of the day.  You can resume normal activity the day after the procedure however you should NOT DRIVE or use heavy machinery for 24 hours (because of the sedation medicines used during the test).    SYMPTOMS TO REPORT IMMEDIATELY: A gastroenterologist can be reached at any hour.  During normal business hours, 8:30 AM to 5:00 PM Monday through Friday,  call [phone].  After hours and on weekends, please call the GI answering service at [phone] who will take a message and have the physician on call contact you.   Following lower endoscopy (colonoscopy or flexible sigmoidoscopy):  Excessive amounts of blood in the stool  Significant tenderness or worsening of abdominal pains  Swelling of the abdomen that is new, acute  Fever of 100F or higher  Following upper endoscopy (EGD)  Vomiting of blood or coffee ground material  New chest pain or pain under the shoulder blades  Painful or persistently difficult swallowing  New shortness of breath  Fever of 100F or higher  Black, tarry-looking stools  FOLLOW UP: If any biopsies were taken you will be contacted by phone or by letter within the next 1-3 weeks.  Call your gastroenterologist if you have not heard about the biopsies in 3 weeks.  Our staff will call the home number listed on your records the next business day following your procedure to check on you and address any questions or concerns that you may have at that time regarding the information given to you following your procedure. This is a courtesy call and so if there is no answer at the home number and we have not heard from you through the emergency physician on call, we will assume that you have returned to your regular daily activities without incident.  SIGNATURES/CONFIDENTIALITY: You and/or your care   partner have signed paperwork which will be entered into your electronic medical record.  These signatures attest to the fact that that the information above on your After Visit Summary has been reviewed and is understood.  Full responsibility of the confidentiality of this discharge information lies with you and/or your care-partner.   OMEPRAZOLE 40 MG EVERY AM ( 30 MIN BEFORE 1ST MEAL OF THE DAY)  FOLLOW ANTI REFLUX REGIMEN  INFORMATION ON ESOPHAGITIS GIVEN TO YOU TODAY  INFORMATION ON POLYPS AND  HEMORRHOIDS.  INFORMATION ON POLYPS AND HEMORRHOIDS GIVEN TO YOU TODAY

## 2013-10-24 NOTE — Op Note (Signed)
[place]  [address] [place], [id number]   COLONOSCOPY PROCEDURE REPORT  PATIENT: Michael [name]  MR#: [id number] BIRTHDATE: 30-Sep-1965 , 47  yrs. old GENDER: Male ENDOSCOPIST: Ladene Artist, MD, [facility] REFERRED NO:[employer], M.D. PROCEDURE DATE:  10/24/2013 PROCEDURE:   Colonoscopy with snare polypectomy First Screening Colonoscopy - Avg.  risk and is 50 yrs.  old or older - No.  Prior Negative Screening - Now for repeat screening. N/A  History of Adenoma - Now for follow-up colonoscopy & has been > or = to 3 yrs.  N/A  Polyps Removed Today? Yes. ASA CLASS:   Class II INDICATIONS:heme-positive stool. MEDICATIONS: MAC sedation, administered by CRNA and propofol (Diprivan) 300mg  IV DESCRIPTION OF PROCEDURE:   After the risks benefits and alternatives of the procedure were thoroughly explained, informed consent was obtained.  A digital rectal exam revealed no abnormalities of the rectum.   The LB [id number]  endoscope was introduced through the anus and advanced to the cecum, which was identified by both the appendix and ileocecal valve. No adverse events experienced.   The quality of the prep was good, using MoviPrep  The instrument was then slowly withdrawn as the colon was fully examined.  COLON FINDINGS: A sessile polyp measuring 7 mm in size was found at the cecum.  A polypectomy was performed with a cold snare.  The resection was complete and the polyp tissue was completely retrieved.   The colon was otherwise normal.  There was no diverticulosis, inflammation, polyps or cancers unless previously stated.  Retroflexed views revealed moderate internal hemorrhoids. The time to cecum=2 minutes 33 seconds.  Withdrawal time=8 minutes 46 seconds.  The scope was withdrawn and the procedure completed. COMPLICATIONS: There were no complications.  ENDOSCOPIC IMPRESSION: 1.   Sessile polyp measuring 7 mm at the cecum; polypectomy performed with a cold  snare 2.   Moderate internal hemorrhoids  RECOMMENDATIONS: 1.  Await pathology results 2.  Repeat colonoscopy in 5 years if polyp adenomatous; otherwise 10 years  eSigned:  Ladene Artist, MD, [facility] 10/24/2013 3:29 PM

## 2013-10-24 NOTE — Op Note (Signed)
[place]  [address] [place], [id number]   ENDOSCOPY PROCEDURE REPORT  PATIENT: [name], Michael Donaghue  MR#: [id number] BIRTHDATE: May 15, 1966 , 47  yrs. old GENDER: Male ENDOSCOPIST: Ladene Artist, MD, [facility] REFERRED BY:  Jill Alexanders, M.D. PROCEDURE DATE:  10/24/2013 PROCEDURE:  EGD, diagnostic ASA CLASS:     Class II INDICATIONS:  History of esophageal reflux.   Heme positive stool. MEDICATIONS: There was residual sedation effect present from prior procedure, MAC sedation, administered by CRNA, and propofol (Diprivan) 100mg  IV TOPICAL ANESTHETIC: none DESCRIPTION OF PROCEDURE: After the risks benefits and alternatives of the procedure were thoroughly explained, informed consent was obtained.  The LB [id number] endoscope was introduced through the mouth and advanced to the second portion of the duodenum. Without limitations.  The instrument was slowly withdrawn as the mucosa was fully examined.  ESOPHAGUS: There was LA Class A esophagitis noted.  The esophagus was otherwise normal. STOMACH: The mucosa and folds of the stomach appeared normal. DUODENUM: The duodenal mucosa showed no abnormalities in the bulb and second portion of the duodenum.  Retroflexed views revealed no abnormalities.  The scope was then withdrawn from the patient and the procedure completed.  COMPLICATIONS: There were no complications.  ENDOSCOPIC IMPRESSION: 1.   LA Class A esophagitis 2.   The EGD was otherwise normal  RECOMMENDATIONS: 1.  Anti-reflux regimen 2.  PPI qam:  omeprazole 40 mg po qam, 1 year of refills  eSigned:  Ladene Artist, MD, [facility] 10/24/2013 3:36 PM

## 2013-10-25 ENCOUNTER — Telehealth: Payer: Self-pay | Admitting: *Deleted

## 2013-10-25 NOTE — Telephone Encounter (Signed)
Left message that we called for f/u 

## 2013-10-29 ENCOUNTER — Encounter: Payer: Self-pay | Admitting: Gastroenterology

## 2013-12-24 ENCOUNTER — Telehealth: Payer: Self-pay | Admitting: Gastroenterology

## 2013-12-24 DIAGNOSIS — M6281 Muscle weakness (generalized): Secondary | ICD-10-CM

## 2013-12-24 NOTE — Telephone Encounter (Signed)
Pt states he has been taking omeprazole for about a month and a half. Pt feels that the medication is causing him to feel very weak, c/o muscle weakness. Pt would like to try something else. Dr. Carlean Purl as doc of the day please advise.

## 2013-12-24 NOTE — Telephone Encounter (Signed)
Hold medication Get a magnesium level tomorrow - sometimes omeprazole depletes magnesium Will advise further after that

## 2013-12-25 NOTE — Telephone Encounter (Signed)
Left message for pt to call back  °

## 2013-12-25 NOTE — Telephone Encounter (Signed)
Pt aware. States he works second shift and will come in tomorrow for labs. Dr. Carlean Purl aware.

## 2014-04-01 ENCOUNTER — Other Ambulatory Visit: Payer: Self-pay | Admitting: Family Medicine

## 2014-04-01 DIAGNOSIS — I10 Essential (primary) hypertension: Secondary | ICD-10-CM

## 2014-04-01 MED ORDER — ATORVASTATIN CALCIUM 20 MG PO TABS: ORAL_TABLET | ORAL | Status: DC

## 2014-04-01 MED ORDER — BISOPROLOL-HYDROCHLOROTHIAZIDE 10-6.25 MG PO TABS
1.0000 | ORAL_TABLET | Freq: Every day | ORAL | Status: DC
Start: 2014-04-01 — End: 2014-04-08

## 2014-04-08 ENCOUNTER — Encounter: Payer: Self-pay | Admitting: Family Medicine

## 2014-04-08 ENCOUNTER — Ambulatory Visit (INDEPENDENT_AMBULATORY_CARE_PROVIDER_SITE_OTHER): Payer: 59 | Admitting: Family Medicine

## 2014-04-08 VITALS — BP 130/90 | HR 67 | Ht 67.0 in | Wt 174.0 lb

## 2014-04-08 DIAGNOSIS — I10 Essential (primary) hypertension: Secondary | ICD-10-CM

## 2014-04-08 DIAGNOSIS — K219 Gastro-esophageal reflux disease without esophagitis: Secondary | ICD-10-CM

## 2014-04-08 DIAGNOSIS — F17200 Nicotine dependence, unspecified, uncomplicated: Secondary | ICD-10-CM

## 2014-04-08 DIAGNOSIS — Z720 Tobacco use: Secondary | ICD-10-CM

## 2014-04-08 DIAGNOSIS — E785 Hyperlipidemia, unspecified: Secondary | ICD-10-CM

## 2014-04-08 DIAGNOSIS — Z23 Encounter for immunization: Secondary | ICD-10-CM

## 2014-04-08 DIAGNOSIS — J301 Allergic rhinitis due to pollen: Secondary | ICD-10-CM

## 2014-04-08 LAB — COMPREHENSIVE METABOLIC PANEL
ALT: 21 U/L (ref 0–53)
AST: 16 U/L (ref 0–37)
Albumin: 4.4 g/dL (ref 3.5–5.2)
Alkaline Phosphatase: 95 U/L (ref 39–117)
BUN: 14 mg/dL (ref 6–23)
CO2: 30 mEq/L (ref 19–32)
Calcium: 9.4 mg/dL (ref 8.4–10.5)
Chloride: 100 mEq/L (ref 96–112)
Creat: 1 mg/dL (ref 0.50–1.35)
Glucose, Bld: 96 mg/dL (ref 70–99)
Potassium: 3.9 mEq/L (ref 3.5–5.3)
Sodium: 138 mEq/L (ref 135–145)
Total Bilirubin: 0.9 mg/dL (ref 0.2–1.2)
Total Protein: 7.1 g/dL (ref 6.0–8.3)

## 2014-04-08 LAB — CBC WITH DIFFERENTIAL/PLATELET
Basophils Absolute: 0.1 K/uL (ref 0.0–0.1)
Basophils Relative: 1 % (ref 0–1)
Eosinophils Absolute: 0.1 K/uL (ref 0.0–0.7)
Eosinophils Relative: 2 % (ref 0–5)
HCT: 46.6 % (ref 39.0–52.0)
Hemoglobin: 16.3 g/dL (ref 13.0–17.0)
Lymphocytes Relative: 19 % (ref 12–46)
Lymphs Abs: 1.3 K/uL (ref 0.7–4.0)
MCH: 31.3 pg (ref 26.0–34.0)
MCHC: 35 g/dL (ref 30.0–36.0)
MCV: 89.4 fL (ref 78.0–100.0)
Monocytes Absolute: 0.4 K/uL (ref 0.1–1.0)
Monocytes Relative: 6 % (ref 3–12)
Neutro Abs: 5.1 K/uL (ref 1.7–7.7)
Neutrophils Relative %: 72 % (ref 43–77)
Platelets: 206 K/uL (ref 150–400)
RBC: 5.21 MIL/uL (ref 4.22–5.81)
RDW: 13.5 % (ref 11.5–15.5)
WBC: 7.1 K/uL (ref 4.0–10.5)

## 2014-04-08 LAB — LIPID PANEL
Cholesterol: 132 mg/dL (ref 0–200)
HDL: 38 mg/dL — ABNORMAL LOW (ref >39)
LDL Cholesterol: 35 mg/dL (ref 0–99)
Total CHOL/HDL Ratio: 3.5 ratio
Triglycerides: 293 mg/dL — ABNORMAL HIGH (ref <150)
VLDL: 59 mg/dL — ABNORMAL HIGH (ref 0–40)

## 2014-04-08 MED ORDER — OMEPRAZOLE 40 MG PO CPDR: 40.0000 mg | DELAYED_RELEASE_CAPSULE | Freq: Every day | ORAL | Status: DC

## 2014-04-08 MED ORDER — BISOPROLOL-HYDROCHLOROTHIAZIDE 10-6.25 MG PO TABS: 1.0000 | ORAL_TABLET | Freq: Every day | ORAL | Status: DC

## 2014-04-08 MED ORDER — ATORVASTATIN CALCIUM 20 MG PO TABS: ORAL_TABLET | ORAL | Status: DC

## 2014-04-08 NOTE — Patient Instructions (Signed)
Stop the Lipitor for a week and see what symptoms go away. Start back and see if the symptoms recur

## 2014-04-08 NOTE — Progress Notes (Signed)
   Subjective:    Patient ID: Michael [name], male    DOB: 1966/01/29, 48 y.o.   MRN: [id number]  HPI He complains of a several month history of intermittent fatigue, myalgias, arthralgias , malaise. The symptoms can last for roughly 30 minutes and occur 3 or 4 times per week. He has had no fever, chills, nausea or vomiting. He thinks that the Prilosec might be causing difficulty. He has been on Lipitor for several years. He admits to not getting enough sleep. He does work third shift and has difficulty unwinding at night. He continues to smoke. He is not interested in quitting. He does have underlying allergies. Review of Systems     Objective:   Physical Exam alert and in no distress. Tympanic membranes and canals are normal. Throat is clear. Tonsils are normal. Neck is supple without adenopathy or thyromegaly. Cardiac exam shows a regular sinus rhythm without murmurs or gallops. Lungs are clear to auscultation.        Assessment & Plan:  Essential hypertension - Plan: bisoprolol-hydrochlorothiazide (ZIAC) 10-6.25 MG per tablet, CBC with Differential, Comprehensive metabolic panel  Gastroesophageal reflux disease without esophagitis - Plan: omeprazole (PRILOSEC) 40 MG capsule  Immunization due - Plan: Flu Vaccine QUAD 36+ mos IM  Allergic rhinitis due to pollen  Hyperlipidemia with target LDL less than 100 - Plan: atorvastatin (LIPITOR) 20 MG tablet, Lipid panel  Smoker  I discussed some of his symptoms and will have him stop Lipitor initially to see if that makes a difference. Explained that I did not think that this was related to Prilosec although that is possible. He will let me know how this works.

## 2014-04-17 ENCOUNTER — Emergency Department (HOSPITAL_COMMUNITY): Payer: 59

## 2014-04-17 ENCOUNTER — Emergency Department (HOSPITAL_COMMUNITY)
Admission: EM | Admit: 2014-04-17 | Discharge: 2014-04-17 | Disposition: A | Discharge status: Home / Self Care | Payer: 59 | Attending: Emergency Medicine | Admitting: Emergency Medicine

## 2014-04-17 ENCOUNTER — Encounter (HOSPITAL_COMMUNITY): Payer: Self-pay | Admitting: Emergency Medicine

## 2014-04-17 DIAGNOSIS — Z8639 Personal history of other endocrine, nutritional and metabolic disease: Secondary | ICD-10-CM | POA: Diagnosis not present

## 2014-04-17 DIAGNOSIS — Z79899 Other long term (current) drug therapy: Secondary | ICD-10-CM | POA: Insufficient documentation

## 2014-04-17 DIAGNOSIS — I10 Essential (primary) hypertension: Secondary | ICD-10-CM | POA: Insufficient documentation

## 2014-04-17 DIAGNOSIS — R079 Chest pain, unspecified: Secondary | ICD-10-CM | POA: Diagnosis present

## 2014-04-17 DIAGNOSIS — Z7982 Long term (current) use of aspirin: Secondary | ICD-10-CM | POA: Insufficient documentation

## 2014-04-17 DIAGNOSIS — R42 Dizziness and giddiness: Secondary | ICD-10-CM | POA: Insufficient documentation

## 2014-04-17 DIAGNOSIS — K219 Gastro-esophageal reflux disease without esophagitis: Secondary | ICD-10-CM | POA: Insufficient documentation

## 2014-04-17 DIAGNOSIS — Z720 Tobacco use: Secondary | ICD-10-CM | POA: Insufficient documentation

## 2014-04-17 DIAGNOSIS — R0789 Other chest pain: Secondary | ICD-10-CM | POA: Diagnosis not present

## 2014-04-17 LAB — I-STAT TROPONIN, ED
Troponin i, poc: 0 ng/mL (ref 0.00–0.08)
Troponin i, poc: 0 ng/mL (ref 0.00–0.08)

## 2014-04-17 LAB — CBC
HCT: 46.4 % (ref 39.0–52.0)
Hemoglobin: 16.3 g/dL (ref 13.0–17.0)
MCH: 31.8 pg (ref 26.0–34.0)
MCHC: 35.1 g/dL (ref 30.0–36.0)
MCV: 90.4 fL (ref 78.0–100.0)
Platelets: 185 10*3/uL (ref 150–400)
RBC: 5.13 MIL/uL (ref 4.22–5.81)
RDW: 12.8 % (ref 11.5–15.5)
WBC: 10 10*3/uL (ref 4.0–10.5)

## 2014-04-17 LAB — BASIC METABOLIC PANEL
Anion gap: 9 (ref 5–15)
BUN: 16 mg/dL (ref 6–23)
CO2: 28 mEq/L (ref 19–32)
Calcium: 9.2 mg/dL (ref 8.4–10.5)
Chloride: 101 mEq/L (ref 96–112)
Creatinine, Ser: 1.09 mg/dL (ref 0.50–1.35)
GFR calc Af Amer: >90 mL/min (ref >90)
GFR calc non Af Amer: 79 mL/min — ABNORMAL LOW (ref >90)
Glucose, Bld: 99 mg/dL (ref 70–99)
Potassium: 4.7 mEq/L (ref 3.7–5.3)
Sodium: 138 mEq/L (ref 137–147)

## 2014-04-17 NOTE — ED Notes (Addendum)
Per pt sts today he was at work and was having dizziness, nausea and chest pressure radiating in to elbow. sts took his BP and was elevated around 190.

## 2014-04-17 NOTE — ED Provider Notes (Signed)
CSN: [id number]     Arrival date & time 04/17/14  1658 History   First MD Initiated Contact with Patient 04/17/14 1713     Chief Complaint  Patient presents with  . Hypertension  . Chest Pain  . Dizziness     (Consider location/radiation/quality/duration/timing/severity/associated sxs/prior Treatment) HPI Michael [name] is a 48 y.o. male with a history of hypertension, GERD, hyperlipidemia comes in for evaluation of chest discomfort and "not feeling right". Patient states he got to work at 3 PM this afternoon, ate an [place] BMT and started to light up a cigarette when he began to experience a "very light pressure" in his chest, felt his face get warm and became "a little swimmy headed". He states the sensation lasted for about an hour. Specifically denies "pressure that feels like someone is standing on my chest". He had his blood pressure checked on site and reported it as 198/110. He denies any associated nausea, vomiting, diaphoresis, radiating pain, numbness or weakness during this event. Patient states he has had this symptomology before with the chest discomfort and swimmy headedness but was concerned because this episode lasted much longer than usual. Patient states his chest discomfort and swimmy headedness have resolved since being in the emergency department. Patient does admit to smoking 10 cigarettes per day. No family hx of cardiac events.   Past Medical History  Diagnosis Date  . Hyperlipidemia   . Allergy   . GERD (gastroesophageal reflux disease)   . Hypertension   . Smoker    Past Surgical History  Procedure Laterality Date  . Cervical fusion  11/02/2012   Family History  Problem Relation Age of Onset  . Diabetes Brother   . Colon polyps Maternal Uncle 13  . Heart attack Maternal Grandfather   . Gallbladder disease Father    History  Substance Use Topics  . Smoking status: Current Every Day Smoker -- 0.10 packs/day    Types: Cigarettes  . Smokeless tobacco:  Never Used  . Alcohol Use: No    Review of Systems  Constitutional: Negative for fever.  HENT: Negative for sore throat.   Eyes: Negative for visual disturbance.  Respiratory: Negative for shortness of breath.   Cardiovascular: Negative for chest pain.       Chest discomfort  Gastrointestinal: Negative for abdominal pain.  Endocrine: Negative for polyuria.  Genitourinary: Negative for dysuria.  Skin: Negative for rash.  Neurological: Positive for light-headedness. Negative for syncope, speech difficulty, weakness and headaches.      Allergies  Atorvastatin  Home Medications   Prior to Admission medications   Medication Sig Start Date End Date Taking? Authorizing Provider  aspirin 325 MG tablet Take 650 mg by mouth every 6 (six) hours as needed for moderate pain.   Yes Historical Provider, MD  bisoprolol-hydrochlorothiazide (ZIAC) 10-6.25 MG per tablet Take 1 tablet by mouth daily. 04/08/14  Yes Denita Lung, MD  ibuprofen (ADVIL,MOTRIN) 200 MG tablet Take 200 mg by mouth every 6 (six) hours as needed.     Yes Historical Provider, MD  omeprazole (PRILOSEC) 40 MG capsule Take 1 capsule (40 mg total) by mouth daily. TAKE 40 MG BY MOUTH EVERY AM 04/08/14  Yes Denita Lung, MD   BP 163/98  Temp(Src) 98.3 F (36.8 C)  Resp 18  SpO2 100% Physical Exam  Nursing note and vitals reviewed. Constitutional: He is oriented to person, place, and time. He appears well-developed and well-nourished.  HENT:  Head: Normocephalic and atraumatic.  Mouth/Throat:  Oropharynx is clear and moist.  Eyes: Conjunctivae are normal. Pupils are equal, round, and reactive to light. Right eye exhibits no discharge. Left eye exhibits no discharge. No scleral icterus.  Neck: Neck supple.  Cardiovascular: Normal rate, regular rhythm and normal heart sounds.   Pulmonary/Chest: Effort normal and breath sounds normal. No respiratory distress. He has no wheezes. He has no rales.  Abdominal: Soft. There is  no tenderness.  Musculoskeletal: He exhibits no tenderness.  Neurological: He is alert and oriented to person, place, and time.  Cranial Nerves II-XII grossly intact. No focal neurodeficits. Gait is baseline without any new or appreciable ataxia.  Skin: Skin is warm and dry. No rash noted.  Psychiatric: He has a normal mood and affect.    ED Course  Procedures (including critical care time) Labs Review Labs Reviewed  [place], ED    Imaging Review No results found.   EKG Interpretation   Date/Time:  Wednesday April 17 2014 17:04:36 EDT Ventricular Rate:  84 PR Interval:  162 QRS Duration: 78 QT Interval:  338 QTC Calculation: 399 R Axis:   76 Text Interpretation:  Normal sinus rhythm Nonspecific T wave abnormality  No previous tracing Confirmed by Maryan Rued  MD, Loree Fee ([id number]) on  04/17/2014 5:21:47 PM      MDM  Vitals stable - WNL -afebrile Heart score 3 Pt resting comfortably in ED. chest pain and other symptomology have resolved in ED without intervention. PE not concerning for other acute or emergent pathology. Labwork noncontributory. Troponin (X2) negative. EKG not concerning Chest x-ray shows no acute cardiopulmonary abnormality.  Discussed f/u with PCP/cardiology and return precautions, pt very amenable to plan. Prior to patient discharge, I discussed and reviewed this [place] with Dr.Plunkett      Final diagnoses:  Chest discomfort          Verl Dicker, PA-C 04/18/14 1203

## 2014-04-17 NOTE — Discharge Instructions (Signed)
You were evaluated today for chest discomfort. Your evaluation the ED showed no emergent cause for your chest discomfort or other symptoms. You may followup with cardiology for further evaluation and management of your chest discomfort. If you begin to experience more pain, fevers, shortness of breath, numbness or weakness please return to the ED for further evaluation and management.

## 2014-04-18 ENCOUNTER — Telehealth: Payer: Self-pay | Admitting: Family Medicine

## 2014-04-18 NOTE — Telephone Encounter (Signed)
Pt went off Lipitor a for 11 days and states muscle aches & joint pains seemed to be getting a little better til yesterday then BP shot up 198/110 & went to ER, chest tightness, foggy feeling.  ER said all normal.  Wondering is Prilosec is causing him problems.  Please call pt and advise

## 2014-04-18 NOTE — ED Provider Notes (Signed)
Medical screening examination/treatment/procedure(s) were performed by non-physician practitioner and as supervising physician I was immediately available for consultation/collaboration.   EKG Interpretation   Date/Time:  Wednesday April 17 2014 17:04:36 EDT Ventricular Rate:  84 PR Interval:  162 QRS Duration: 78 QT Interval:  338 QTC Calculation: 399 R Axis:   76 Text Interpretation:  Normal sinus rhythm Nonspecific T wave abnormality  No previous tracing Confirmed by Maryan Rued  MD, Cally Nygard ([id number]) on  04/17/2014 5:21:47 PM        Blanchie Dessert, MD 04/18/14 2346

## 2014-04-18 NOTE — Telephone Encounter (Signed)
I doubt Prilosec caused that. Have him set up an appointment to see me for follow

## 2014-04-22 NOTE — Telephone Encounter (Signed)
Left message that pt needs to call and make an appt to follow-up on this

## 2014-04-23 ENCOUNTER — Ambulatory Visit (INDEPENDENT_AMBULATORY_CARE_PROVIDER_SITE_OTHER): Payer: 59 | Admitting: Family Medicine

## 2014-04-23 ENCOUNTER — Encounter: Payer: Self-pay | Admitting: Family Medicine

## 2014-04-23 VITALS — BP 130/90 | HR 82 | Wt 175.0 lb

## 2014-04-23 DIAGNOSIS — E785 Hyperlipidemia, unspecified: Secondary | ICD-10-CM

## 2014-04-23 DIAGNOSIS — K219 Gastro-esophageal reflux disease without esophagitis: Secondary | ICD-10-CM

## 2014-04-23 DIAGNOSIS — I10 Essential (primary) hypertension: Secondary | ICD-10-CM

## 2014-04-23 NOTE — Patient Instructions (Addendum)
Start back on the Lipitor and if your symptoms recur, call me and we will switch to a different medicine. On Nexium after you started on the Lipitor. Call me Monday

## 2014-04-23 NOTE — Progress Notes (Signed)
   Subjective:    Patient ID: Jeneen Rinks A Lema, male    DOB: 06-28-1966, 48 y.o.   MRN: [id number]  HPI He is here for consult after recent trip to the emergency room. ER record was reviewed. He was having some chest discomfort as well as vague symptoms of length foggy. He has stopped his Lipitor mainly due to questions of knee and muscle pain. Presently he is having much less of this. He also is concerned that the Prilosec might be causing some of his other symptoms and has been off this for approximately 1 week. He still does feel him what foggy.   Review of Systems     Objective:   Physical Exam Alert and in no distress otherwise not examined       Assessment & Plan:  Essential hypertension  Hyperlipidemia with target LDL less than 100  Gastroesophageal reflux disease without esophagitis  the emergency room visit was discussed as well as his other symptoms. He will start taking the Lipitor again and if the aches and pains recur, I will switch him to Crestor. He will also stop the Prilosec and start Nexium but do this after he has determined whether the Lipitor is causing any difficulties.

## 2014-05-02 ENCOUNTER — Telehealth: Payer: Self-pay | Admitting: Family Medicine

## 2014-05-02 NOTE — Telephone Encounter (Signed)
Pt says since being back on Lipitor, he has not has any muscle or joint pain. Also he says he was to start an over the counter acid reflux med if this all worked fine.

## 2014-05-02 NOTE — Telephone Encounter (Signed)
Have him continue on that regimen

## 2014-05-04 IMAGING — CR DG CERVICAL SPINE COMPLETE 4+V
6 series · 6 of 6 positions shown · non-contrast
Comparison: None.

CLINICAL DATA: Left neck and shoulder pain.

CERVICAL SPINE - COMPLETE 4+ VIEW

[view not recorded (1 of 6)]
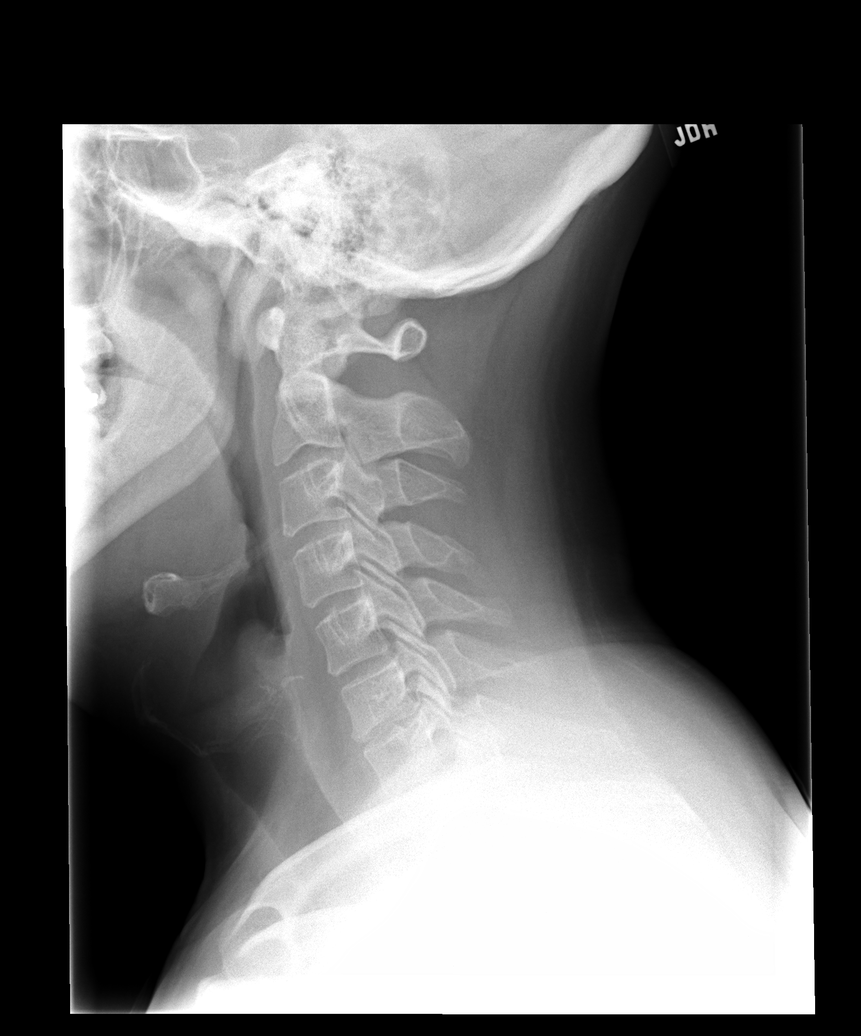

[view not recorded (2 of 6)]
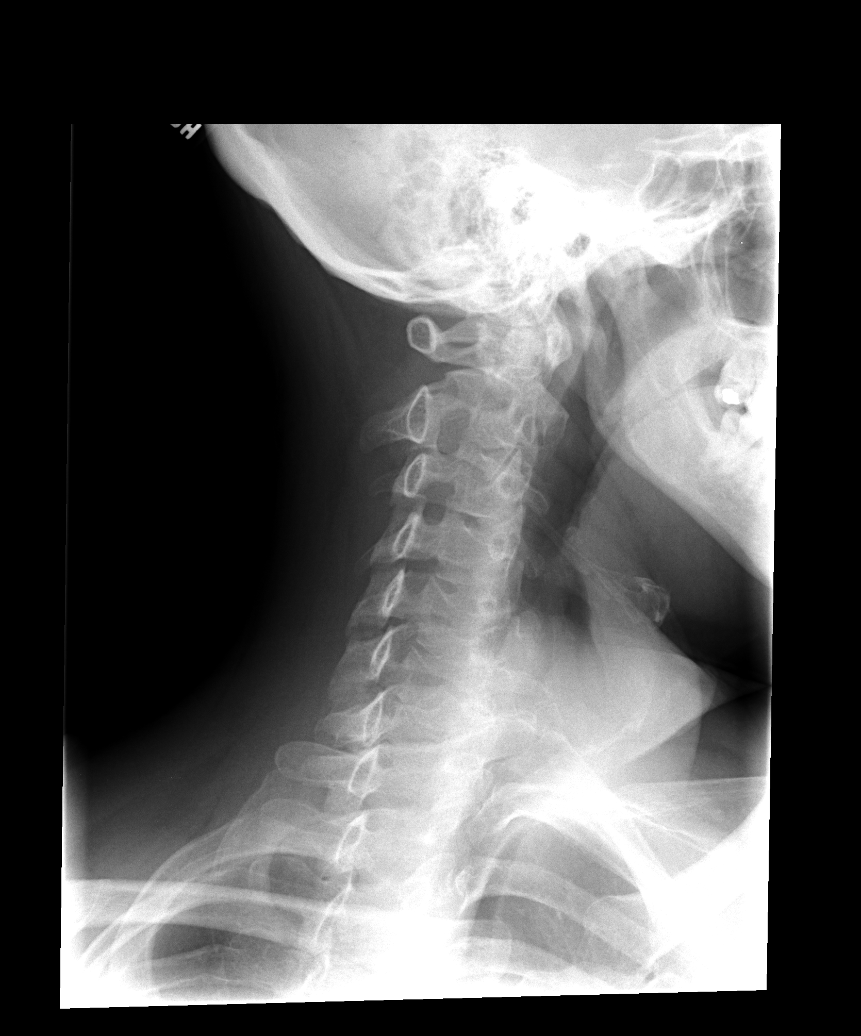

[view not recorded (3 of 6)]
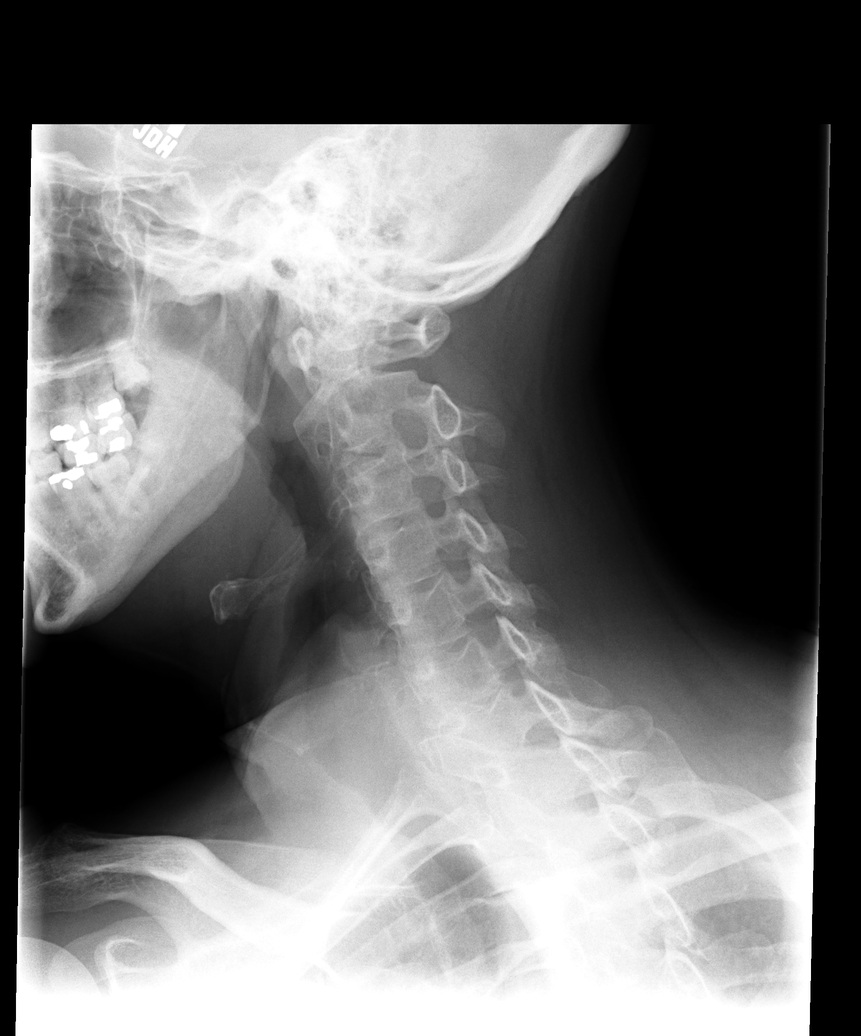

[view not recorded (4 of 6)]
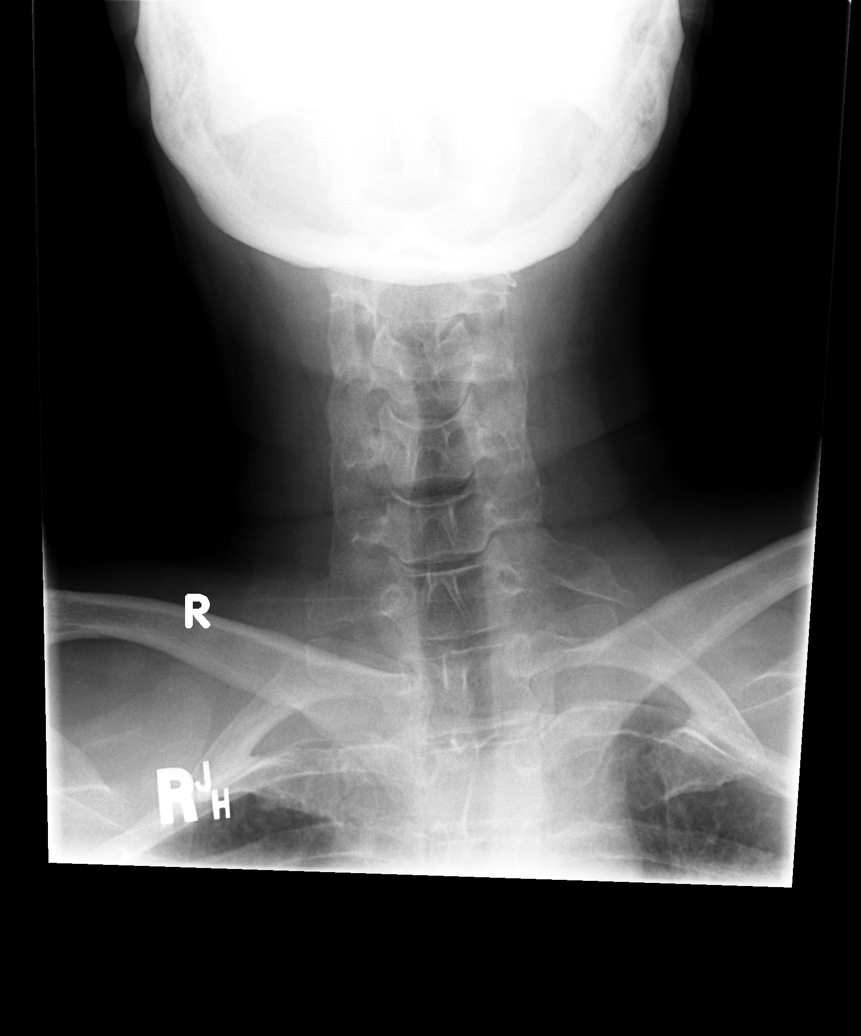

[view not recorded (5 of 6)]
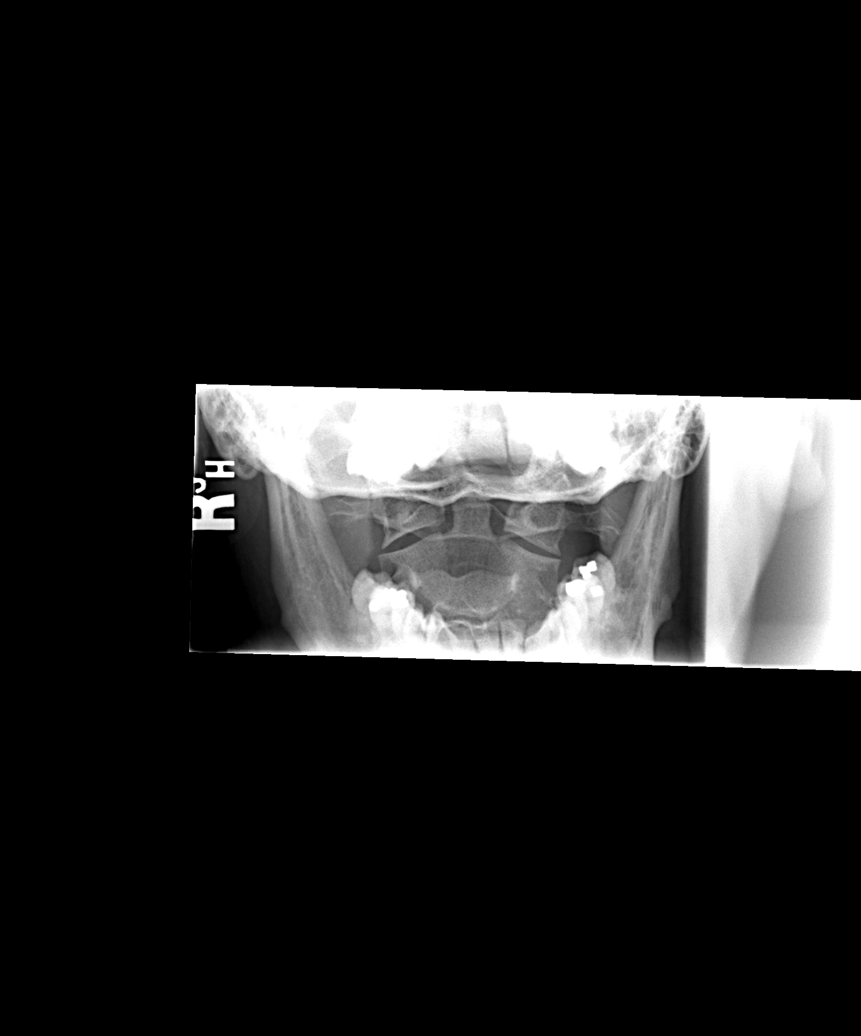

[view not recorded (6 of 6)]
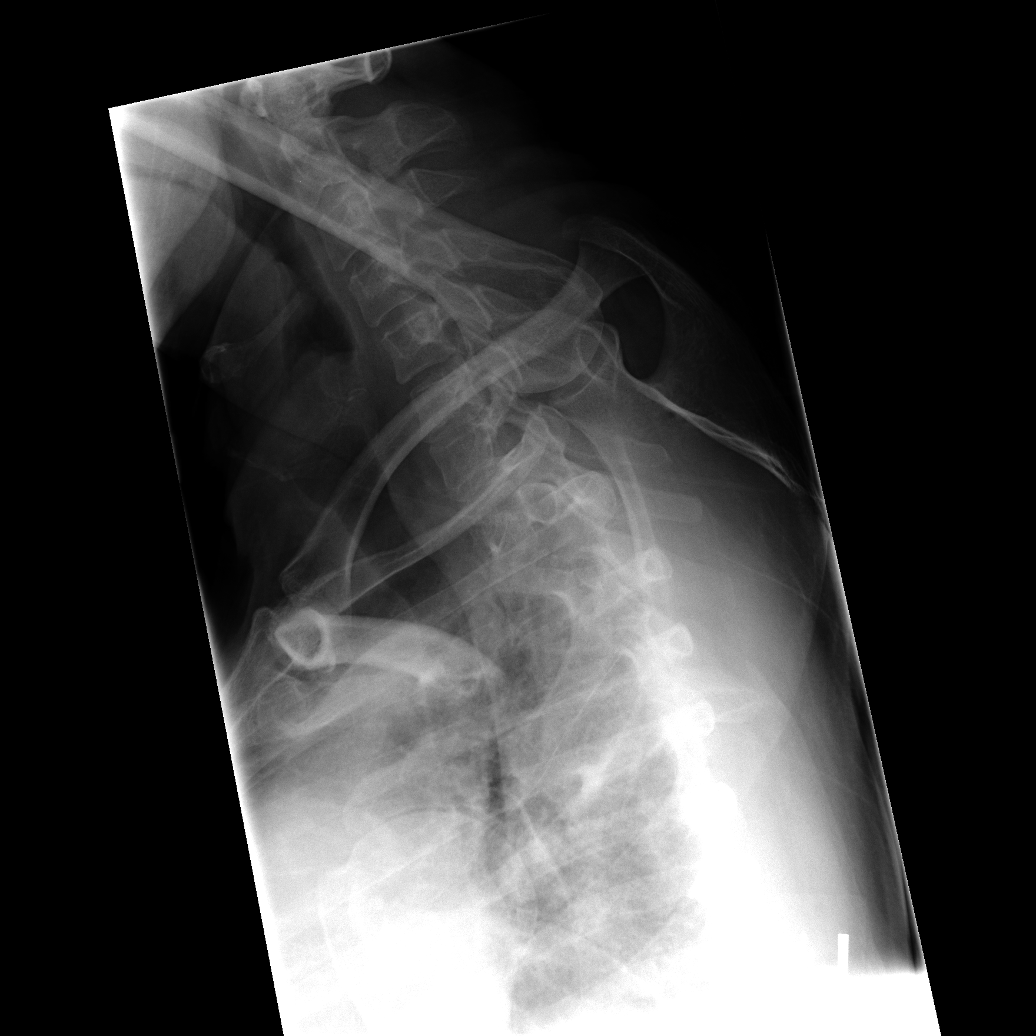

[6 of 6 positions shown; findings below may reference images not displayed]

FINDINGS: Mild narrowing of the C6-7 interspace with early endplate
spurring.  No prevertebral soft tissue swelling.  No other
significant osseous degenerative change.  Negative for fracture.
Multiple dental restorations.
IMPRESSION: 1.  Early degenerative disc disease C6-7.

## 2014-05-06 NOTE — Telephone Encounter (Signed)
Pt informed and verbalized understanding

## 2014-12-31 ENCOUNTER — Encounter: Payer: Self-pay | Admitting: Family Medicine

## 2014-12-31 ENCOUNTER — Ambulatory Visit (INDEPENDENT_AMBULATORY_CARE_PROVIDER_SITE_OTHER): Payer: BLUE CROSS/BLUE SHIELD | Admitting: Family Medicine

## 2014-12-31 VITALS — BP 130/90 | HR 75 | Wt 172.0 lb

## 2014-12-31 DIAGNOSIS — R5383 Other fatigue: Secondary | ICD-10-CM | POA: Diagnosis not present

## 2014-12-31 LAB — COMPREHENSIVE METABOLIC PANEL
ALT: 21 U/L (ref 0–53)
AST: 17 U/L (ref 0–37)
Albumin: 4.8 g/dL (ref 3.5–5.2)
Alkaline Phosphatase: 87 U/L (ref 39–117)
BUN: 22 mg/dL (ref 6–23)
CO2: 25 mEq/L (ref 19–32)
Calcium: 9.7 mg/dL (ref 8.4–10.5)
Chloride: 102 mEq/L (ref 96–112)
Creat: 1 mg/dL (ref 0.50–1.35)
Glucose, Bld: 88 mg/dL (ref 70–99)
Potassium: 3.8 mEq/L (ref 3.5–5.3)
Sodium: 137 mEq/L (ref 135–145)
Total Bilirubin: 0.8 mg/dL (ref 0.2–1.2)
Total Protein: 7.4 g/dL (ref 6.0–8.3)

## 2014-12-31 LAB — CBC WITH DIFFERENTIAL/PLATELET
Basophils Absolute: 0.1 10*3/uL (ref 0.0–0.1)
Basophils Relative: 1 % (ref 0–1)
Eosinophils Absolute: 0.1 10*3/uL (ref 0.0–0.7)
Eosinophils Relative: 1 % (ref 0–5)
HCT: 47.6 % (ref 39.0–52.0)
Hemoglobin: 16.8 g/dL (ref 13.0–17.0)
Lymphocytes Relative: 19 % (ref 12–46)
Lymphs Abs: 1.4 10*3/uL (ref 0.7–4.0)
MCH: 32 pg (ref 26.0–34.0)
MCHC: 35.3 g/dL (ref 30.0–36.0)
MCV: 90.7 fL (ref 78.0–100.0)
MPV: 11.9 fL (ref 8.6–12.4)
Monocytes Absolute: 0.5 10*3/uL (ref 0.1–1.0)
Monocytes Relative: 6 % (ref 3–12)
Neutro Abs: 5.5 10*3/uL (ref 1.7–7.7)
Neutrophils Relative %: 73 % (ref 43–77)
Platelets: 211 10*3/uL (ref 150–400)
RBC: 5.25 MIL/uL (ref 4.22–5.81)
RDW: 13.4 % (ref 11.5–15.5)
WBC: 7.6 10*3/uL (ref 4.0–10.5)

## 2014-12-31 NOTE — Progress Notes (Signed)
   Subjective:    Patient ID: Michael [name], male    DOB: Nov 03, 1965, 49 y.o.   MRN: [id number]  HPI He complains of a three-month history of intermittent hand,elbow and shoulder pain and a feeling of weakness. He also complains of intermittent fatigue. No numbness or tingling or dropping of items. Work load has not changed.He does have underlying sinus trouble and again this is intermittent and occasionally does cause difficulty with slight dizziness. He does have a previous history of left cervical disc disease with surgery. He does have a previous history of difficulty with statin drugs causing myalgias and these symptoms are somewhat similar to that.  Review of Systems     Objective:   Physical Exam Scar noted on left neck. Full motion of the neck without pain. Normal motion of the shoulder. Normal motor, sensory and DTRs of the arms.       Assessment & Plan:  Other fatigue - Plan: CBC with Differential/Platelet, Comprehensive metabolic panel, Sedimentation rate I will wait on lab results before any further intervention. This could possibly be related to statins.

## 2015-01-01 LAB — SEDIMENTATION RATE: Sed Rate: 1 mm/hr (ref 0–15)

## 2015-05-14 ENCOUNTER — Ambulatory Visit (INDEPENDENT_AMBULATORY_CARE_PROVIDER_SITE_OTHER): Payer: BLUE CROSS/BLUE SHIELD | Admitting: Family Medicine

## 2015-05-14 ENCOUNTER — Encounter: Payer: Self-pay | Admitting: Family Medicine

## 2015-05-14 VITALS — BP 124/88 | HR 88 | Ht 65.0 in | Wt 176.0 lb

## 2015-05-14 DIAGNOSIS — Z23 Encounter for immunization: Secondary | ICD-10-CM

## 2015-05-14 DIAGNOSIS — E785 Hyperlipidemia, unspecified: Secondary | ICD-10-CM | POA: Diagnosis not present

## 2015-05-14 DIAGNOSIS — I10 Essential (primary) hypertension: Secondary | ICD-10-CM

## 2015-05-14 DIAGNOSIS — R42 Dizziness and giddiness: Secondary | ICD-10-CM

## 2015-05-14 LAB — LIPID PANEL
Cholesterol: 145 mg/dL (ref 125–200)
HDL: 42 mg/dL (ref >=40)
LDL Cholesterol: 53 mg/dL (ref <130)
Total CHOL/HDL Ratio: 3.5 Ratio (ref <=5.0)
Triglycerides: 249 mg/dL — ABNORMAL HIGH (ref <150)
VLDL: 50 mg/dL — ABNORMAL HIGH (ref <30)

## 2015-05-14 NOTE — Patient Instructions (Addendum)
You need to pay attention to what ,when ,where and why the symptoms are occurring and write them down

## 2015-05-14 NOTE — Progress Notes (Signed)
   Subjective:    Patient ID: Michael [name], male    DOB: 07-12-1965, 49 y.o.   MRN: [id number]  HPI He is here for evaluation of a three-month history of intermittent dizziness and wooziness they can last anywhere from 10 minutes to an hour. This can occur mainly at work but also occurs at home. He also occasionally have some blurred vision but no nausea, vomiting. He also then complained of left arm discomfort. He has had previous surgery for cervical disc on left. It was very difficult for him to stay on task as he kept changing the subject and give mini symptoms.he also needs blood work for his hyperlipidemia. He does have a history of hypertension.   Review of Systems     Objective:   Physical Exam Alert and in no distress. Tympanic membranes and canals are normal. Pharyngeal area is normal. Neck is supple without adenopathy or thyromegaly. Cardiac exam shows a regular sinus rhythm without murmurs or gallops. Lungs are clear to auscultation.normal upper body strength with normal DTRs.        Assessment & Plan:  Hyperlipidemia with target LDL less than 100 - Plan: Lipid panel  Essential hypertension  Need for prophylactic vaccination and inoculation against influenza - Plan: Flu Vaccine QUAD 36+ mos PF IM (Fluarix & Fluzone Quad PF)  Dizziness and giddiness he was very difficult for him to give me a  Good history for anything in particular as he kept changing the subject. I asked him to keep track of his symptoms in regards to what when where Y and how and let me know. Explained that I did not think that he was in any danger at the present time.

## 2015-05-15 MED ORDER — ATORVASTATIN CALCIUM 20 MG PO TABS: 20.0000 mg | ORAL_TABLET | Freq: Every day | ORAL | Status: DC

## 2015-05-15 NOTE — Addendum Note (Signed)
Addended by: Denita Lung on: 05/15/2015 07:53 AM   Modules accepted: Orders

## 2015-06-29 ENCOUNTER — Other Ambulatory Visit: Payer: Self-pay | Admitting: Family Medicine

## 2015-11-14 IMAGING — CR DG CHEST 2V
2 series · 2 of 2 positions shown · non-contrast
Comparison: No priors.

CLINICAL DATA: 48-year-old male with history of central chest pain
and dizziness today. Smoker.

EXAM:
CHEST  2 VIEW

[w chest pa]
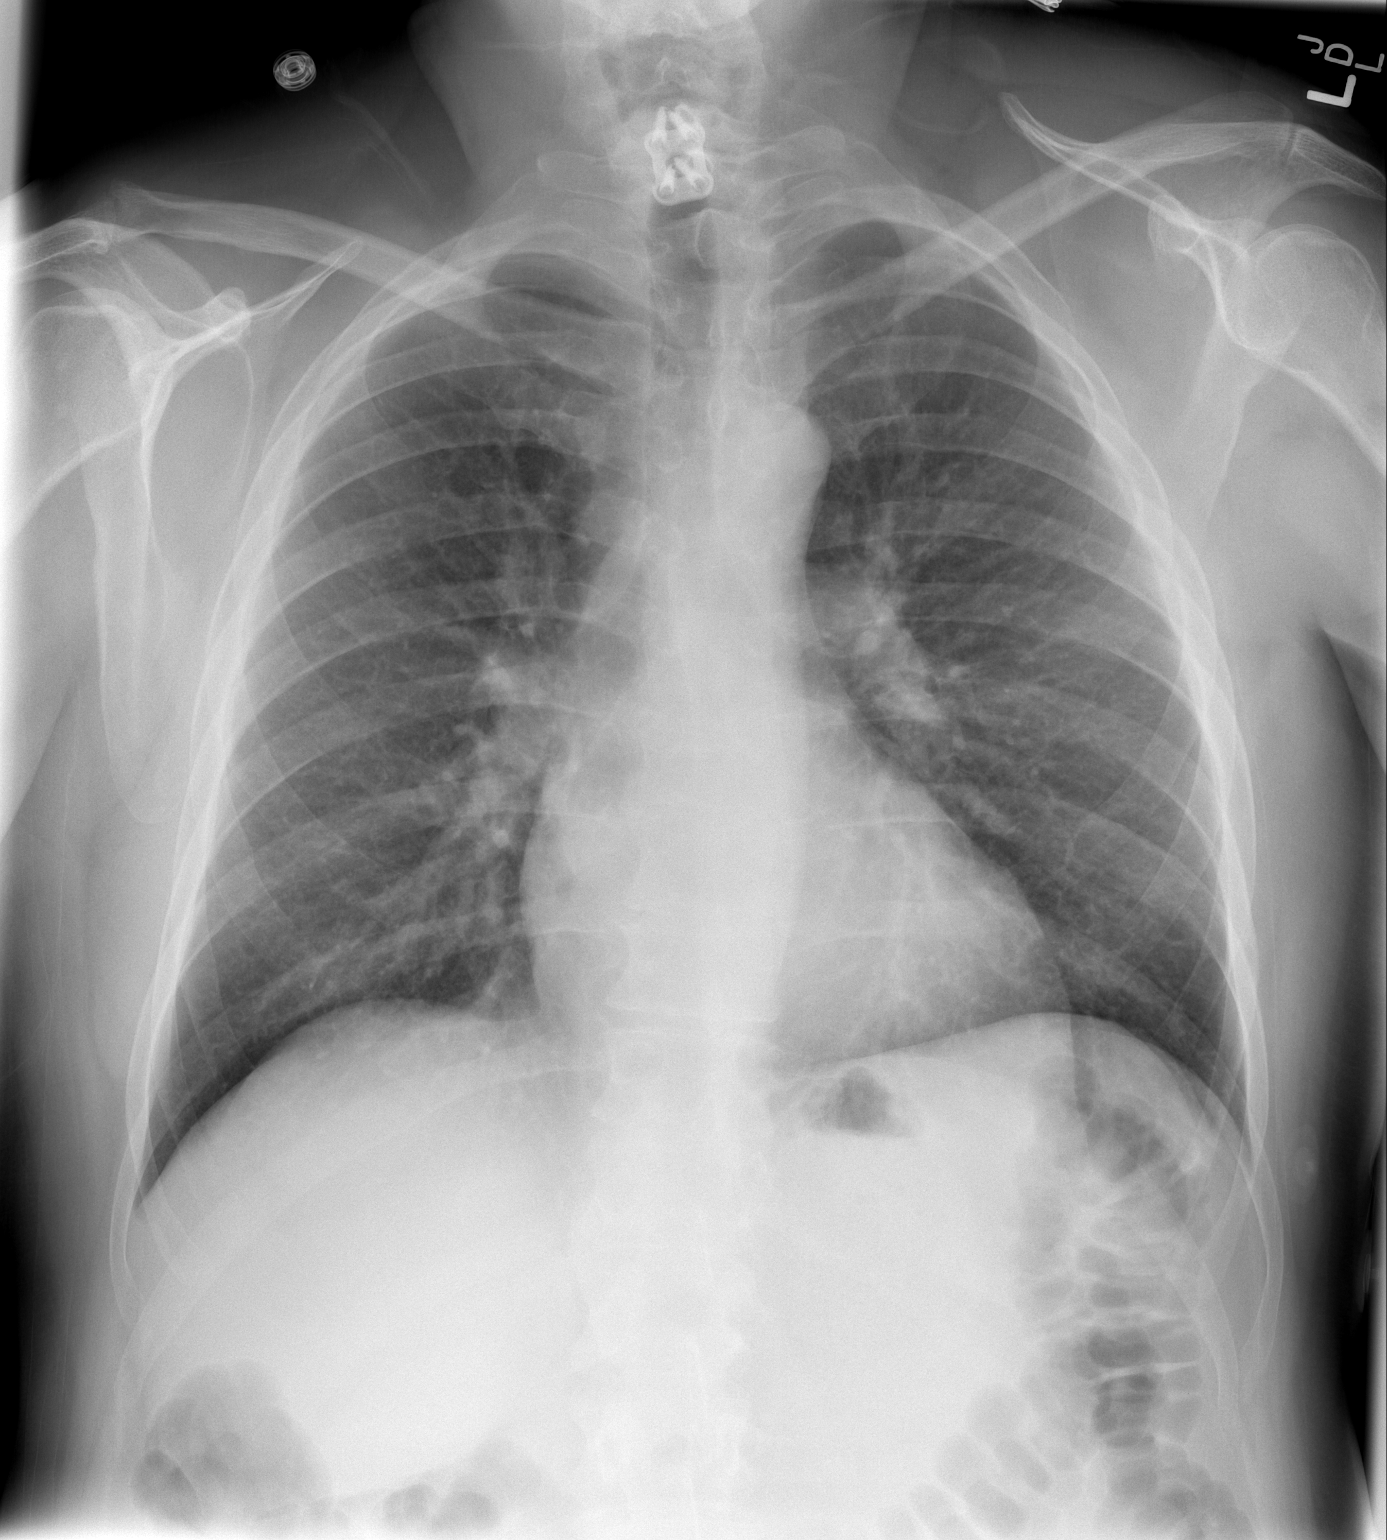

[w chest lat]
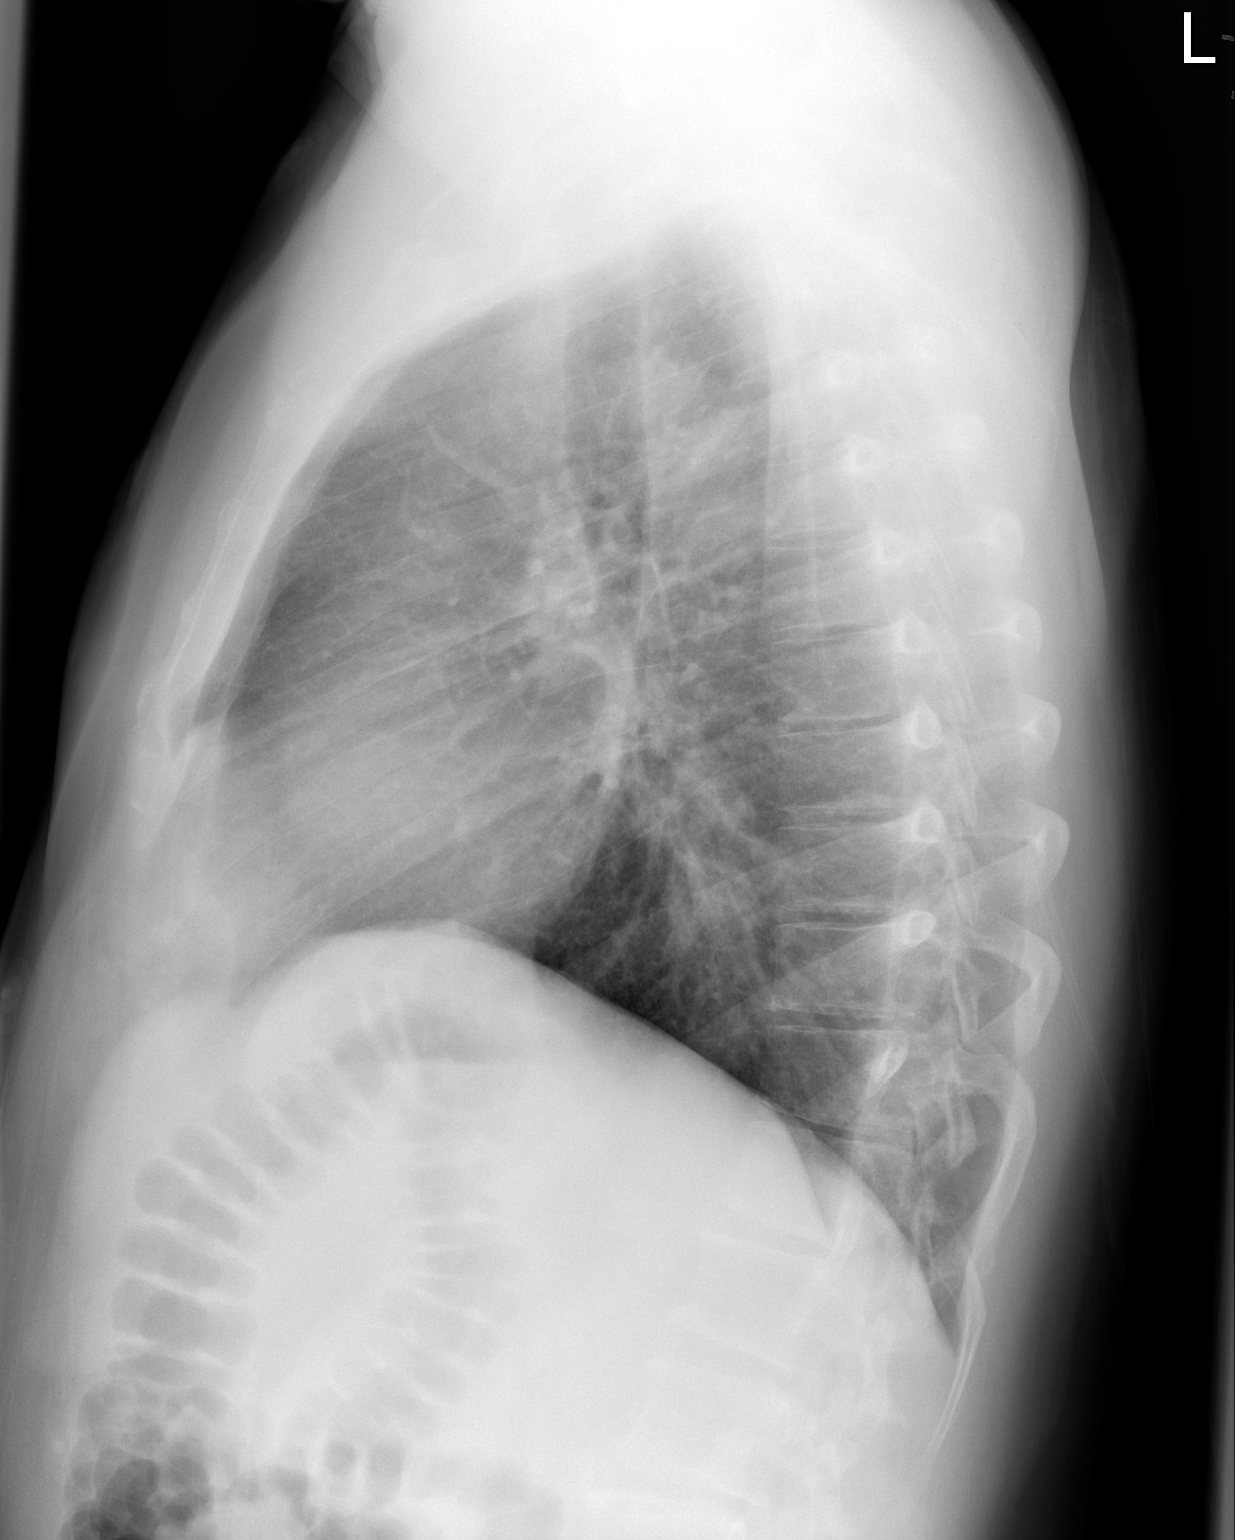

[2 of 2 positions shown; findings below may reference images not displayed]

FINDINGS: Lung volumes are normal. No consolidative airspace disease. No
pleural effusions. No pneumothorax. No pulmonary nodule or mass
noted. Pulmonary vasculature and the cardiomediastinal silhouette
are within normal limits. Orthopedic fixation hardware in the lower
cervical spine incidentally noted.
IMPRESSION: No radiographic evidence of acute cardiopulmonary disease.

## 2015-12-26 ENCOUNTER — Telehealth: Payer: Self-pay

## 2015-12-26 MED ORDER — BISOPROLOL-HYDROCHLOROTHIAZIDE 10-6.25 MG PO TABS: 1.0000 | ORAL_TABLET | Freq: Every day | ORAL | Status: DC

## 2015-12-26 NOTE — Telephone Encounter (Signed)
Fax request for bisoprolol-HCTZ 10-6.25mg  tablets to [facility].

## 2016-03-24 ENCOUNTER — Other Ambulatory Visit: Payer: Self-pay | Admitting: Family Medicine

## 2016-05-10 ENCOUNTER — Telehealth: Payer: Self-pay | Admitting: Family Medicine

## 2016-05-10 ENCOUNTER — Other Ambulatory Visit: Payer: Self-pay

## 2016-05-10 MED ORDER — ATORVASTATIN CALCIUM 20 MG PO TABS: 20.0000 mg | ORAL_TABLET | Freq: Every day | ORAL | 0 refills | Status: DC

## 2016-05-10 NOTE — Telephone Encounter (Signed)
This has been done.

## 2016-05-10 NOTE — Telephone Encounter (Signed)
Pt made a cpe appt please refills atorvastatin to [facility] [address].

## 2016-05-18 ENCOUNTER — Telehealth: Payer: Self-pay

## 2016-05-18 ENCOUNTER — Encounter: Payer: BLUE CROSS/BLUE SHIELD | Admitting: Family Medicine

## 2016-05-18 DIAGNOSIS — Z0000 Encounter for general adult medical examination without abnormal findings: Secondary | ICD-10-CM

## 2016-05-18 NOTE — Telephone Encounter (Signed)
Pt forgot about appointment he just woke up

## 2016-05-18 NOTE — Telephone Encounter (Signed)

## 2016-05-18 NOTE — Telephone Encounter (Signed)
Call and find out what happened

## 2016-05-19 ENCOUNTER — Encounter: Payer: Self-pay | Admitting: Family Medicine

## 2016-06-23 ENCOUNTER — Telehealth: Payer: Self-pay

## 2016-06-23 ENCOUNTER — Other Ambulatory Visit: Payer: Self-pay

## 2016-06-23 MED ORDER — BISOPROLOL-HYDROCHLOROTHIAZIDE 10-6.25 MG PO TABS: 1.0000 | ORAL_TABLET | Freq: Every day | ORAL | 0 refills | Status: DC

## 2016-06-23 NOTE — Telephone Encounter (Signed)
Pt needs refill bisoprolol- HCTZ sent to [facility] Please call in. Pt has scheduled appt for 06/30/2016. Victorino December

## 2016-06-23 NOTE — Telephone Encounter (Signed)
Sent in med

## 2016-06-30 ENCOUNTER — Encounter: Payer: Self-pay | Admitting: Family Medicine

## 2016-06-30 ENCOUNTER — Ambulatory Visit (INDEPENDENT_AMBULATORY_CARE_PROVIDER_SITE_OTHER): Payer: BLUE CROSS/BLUE SHIELD | Admitting: Family Medicine

## 2016-06-30 VITALS — BP 128/70 | HR 71 | Resp 16 | Wt 184.2 lb

## 2016-06-30 DIAGNOSIS — F17200 Nicotine dependence, unspecified, uncomplicated: Secondary | ICD-10-CM

## 2016-06-30 DIAGNOSIS — E785 Hyperlipidemia, unspecified: Secondary | ICD-10-CM

## 2016-06-30 DIAGNOSIS — Z86010 Personal history of colonic polyps: Secondary | ICD-10-CM | POA: Insufficient documentation

## 2016-06-30 DIAGNOSIS — I10 Essential (primary) hypertension: Secondary | ICD-10-CM | POA: Diagnosis not present

## 2016-06-30 DIAGNOSIS — Z23 Encounter for immunization: Secondary | ICD-10-CM

## 2016-06-30 LAB — CBC WITH DIFFERENTIAL/PLATELET
Basophils Absolute: 74 cells/uL (ref 0–200)
Basophils Relative: 1 %
Eosinophils Absolute: 148 cells/uL (ref 15–500)
Eosinophils Relative: 2 %
HCT: 47.1 % (ref 38.5–50.0)
Hemoglobin: 16.1 g/dL (ref 13.2–17.1)
Lymphocytes Relative: 19 %
Lymphs Abs: 1406 cells/uL (ref 850–3900)
MCH: 31.8 pg (ref 27.0–33.0)
MCHC: 34.2 g/dL (ref 32.0–36.0)
MCV: 93.1 fL (ref 80.0–100.0)
MPV: 12.1 fL (ref 7.5–12.5)
Monocytes Absolute: 444 cells/uL (ref 200–950)
Monocytes Relative: 6 %
Neutro Abs: 5328 cells/uL (ref 1500–7800)
Neutrophils Relative %: 72 %
Platelets: 192 10*3/uL (ref 140–400)
RBC: 5.06 MIL/uL (ref 4.20–5.80)
RDW: 13.4 % (ref 11.0–15.0)
WBC: 7.4 10*3/uL (ref 4.0–10.5)

## 2016-06-30 MED ORDER — ATORVASTATIN CALCIUM 20 MG PO TABS: 20.0000 mg | ORAL_TABLET | Freq: Every day | ORAL | 3 refills | Status: DC

## 2016-06-30 MED ORDER — BISOPROLOL-HYDROCHLOROTHIAZIDE 10-6.25 MG PO TABS: 1.0000 | ORAL_TABLET | Freq: Every day | ORAL | 3 refills | Status: DC

## 2016-06-30 NOTE — Progress Notes (Signed)
   Subjective:    Patient ID: Michael [name] A Stjames, male    DOB: 12-27-1965, 51 y.o.   MRN: [id number]  HPI He is here for an interval evaluation. He was supposed to be here in November but missed his appointment. He is doing quite well on his present blood pressure medication as well as Lipitor. He also uses aspirin and ibuprofen on an as-needed basis. He did bring his blood pressure machine in with him and measured against ours. He does smoke but less than 5 cigarettes per day. He is not ready to quit. He view of his record indicates previous history of colonic polyps and is now on a 5 year schedule.   Review of Systems     Objective:   Physical Exam Alert and in no distress. Cardiac exam shows regular rhythm without murmurs or gallop. Lungs are clear to auscultation.       Assessment & Plan:  Hyperlipidemia with target LDL less than 100 - Plan: Lipid panel  Essential hypertension - Plan: CBC with Differential/Platelet, Comprehensive metabolic panel  Need for prophylactic vaccination and inoculation against influenza - Plan: Flu Vaccine QUAD 36+ mos IM  History of colonic polyps  Smoker I will do routine blood work and renew his medications. He is to return here closed his 51th birthday for complete examination.

## 2016-07-01 LAB — COMPREHENSIVE METABOLIC PANEL
ALT: 34 U/L (ref 9–46)
AST: 24 U/L (ref 10–40)
Albumin: 4.5 g/dL (ref 3.6–5.1)
Alkaline Phosphatase: 114 U/L (ref 40–115)
BUN: 16 mg/dL (ref 7–25)
CO2: 25 mmol/L (ref 20–31)
Calcium: 9.5 mg/dL (ref 8.6–10.3)
Chloride: 102 mmol/L (ref 98–110)
Creat: 0.91 mg/dL (ref 0.60–1.35)
Glucose, Bld: 93 mg/dL (ref 65–99)
Potassium: 4.2 mmol/L (ref 3.5–5.3)
Sodium: 140 mmol/L (ref 135–146)
Total Bilirubin: 0.9 mg/dL (ref 0.2–1.2)
Total Protein: 7.2 g/dL (ref 6.1–8.1)

## 2016-07-01 LAB — LIPID PANEL
Cholesterol: 161 mg/dL (ref <200)
HDL: 42 mg/dL (ref >40)
LDL Cholesterol: 40 mg/dL (ref <100)
Total CHOL/HDL Ratio: 3.8 Ratio (ref <5.0)
Triglycerides: 393 mg/dL — ABNORMAL HIGH (ref <150)
VLDL: 79 mg/dL — ABNORMAL HIGH (ref <30)

## 2016-09-02 ENCOUNTER — Telehealth: Payer: Self-pay | Admitting: Family Medicine

## 2016-09-02 ENCOUNTER — Other Ambulatory Visit: Payer: Self-pay

## 2016-09-02 MED ORDER — OSELTAMIVIR PHOSPHATE 75 MG PO CAPS: 75.0000 mg | ORAL_CAPSULE | Freq: Every day | ORAL | 0 refills | Status: DC

## 2016-09-02 NOTE — Telephone Encounter (Signed)
Pt called and his son was diagnosed with the flu and he wants to know if he could get a rx for the Tamiflu for as a  Preventive, pt uses [facility], [place] and pt can be reached at [phone] with any questions

## 2016-11-02 ENCOUNTER — Ambulatory Visit (INDEPENDENT_AMBULATORY_CARE_PROVIDER_SITE_OTHER): Payer: BLUE CROSS/BLUE SHIELD | Admitting: Family Medicine

## 2016-11-02 ENCOUNTER — Encounter: Payer: Self-pay | Admitting: Family Medicine

## 2016-11-02 VITALS — BP 126/86 | HR 80 | Ht 65.0 in | Wt 179.0 lb

## 2016-11-02 DIAGNOSIS — Z86010 Personal history of colonic polyps: Secondary | ICD-10-CM | POA: Diagnosis not present

## 2016-11-02 DIAGNOSIS — F17200 Nicotine dependence, unspecified, uncomplicated: Secondary | ICD-10-CM

## 2016-11-02 DIAGNOSIS — Z0000 Encounter for general adult medical examination without abnormal findings: Secondary | ICD-10-CM

## 2016-11-02 DIAGNOSIS — J301 Allergic rhinitis due to pollen: Secondary | ICD-10-CM

## 2016-11-02 DIAGNOSIS — M25562 Pain in left knee: Secondary | ICD-10-CM | POA: Diagnosis not present

## 2016-11-02 DIAGNOSIS — I10 Essential (primary) hypertension: Secondary | ICD-10-CM | POA: Diagnosis not present

## 2016-11-02 DIAGNOSIS — E785 Hyperlipidemia, unspecified: Secondary | ICD-10-CM

## 2016-11-02 DIAGNOSIS — K219 Gastro-esophageal reflux disease without esophagitis: Secondary | ICD-10-CM | POA: Diagnosis not present

## 2016-11-02 LAB — LIPID PANEL
Cholesterol: 137 mg/dL (ref <200)
HDL: 38 mg/dL — ABNORMAL LOW (ref >40)
LDL Cholesterol: 38 mg/dL (ref <100)
Total CHOL/HDL Ratio: 3.6 Ratio (ref <5.0)
Triglycerides: 303 mg/dL — ABNORMAL HIGH (ref <150)
VLDL: 61 mg/dL — ABNORMAL HIGH (ref <30)

## 2016-11-02 LAB — COMPREHENSIVE METABOLIC PANEL
ALT: 21 U/L (ref 9–46)
AST: 18 U/L (ref 10–40)
Albumin: 4.3 g/dL (ref 3.6–5.1)
Alkaline Phosphatase: 92 U/L (ref 40–115)
BUN: 19 mg/dL (ref 7–25)
CO2: 21 mmol/L (ref 20–31)
Calcium: 9.1 mg/dL (ref 8.6–10.3)
Chloride: 106 mmol/L (ref 98–110)
Creat: 0.92 mg/dL (ref 0.60–1.35)
Glucose, Bld: 97 mg/dL (ref 65–99)
Potassium: 3.9 mmol/L (ref 3.5–5.3)
Sodium: 139 mmol/L (ref 135–146)
Total Bilirubin: 0.8 mg/dL (ref 0.2–1.2)
Total Protein: 7 g/dL (ref 6.1–8.1)

## 2016-11-02 LAB — CBC WITH DIFFERENTIAL/PLATELET
Basophils Absolute: 72 cells/uL (ref 0–200)
Basophils Relative: 1 %
Eosinophils Absolute: 144 cells/uL (ref 15–500)
Eosinophils Relative: 2 %
HCT: 45.4 % (ref 38.5–50.0)
Hemoglobin: 15.8 g/dL (ref 13.2–17.1)
Lymphocytes Relative: 22 %
Lymphs Abs: 1584 cells/uL (ref 850–3900)
MCH: 31.9 pg (ref 27.0–33.0)
MCHC: 34.8 g/dL (ref 32.0–36.0)
MCV: 91.7 fL (ref 80.0–100.0)
MPV: 12.2 fL (ref 7.5–12.5)
Monocytes Absolute: 432 cells/uL (ref 200–950)
Monocytes Relative: 6 %
Neutro Abs: 4968 cells/uL (ref 1500–7800)
Neutrophils Relative %: 69 %
Platelets: 201 10*3/uL (ref 140–400)
RBC: 4.95 MIL/uL (ref 4.20–5.80)
RDW: 13.2 % (ref 11.0–15.0)
WBC: 7.2 10*3/uL (ref 4.0–10.5)

## 2016-11-02 MED ORDER — ATORVASTATIN CALCIUM 20 MG PO TABS: 20.0000 mg | ORAL_TABLET | Freq: Every day | ORAL | 3 refills | Status: DC

## 2016-11-02 MED ORDER — BISOPROLOL-HYDROCHLOROTHIAZIDE 10-6.25 MG PO TABS
1.0000 | ORAL_TABLET | Freq: Every day | ORAL | 3 refills | Status: DC
Start: 2016-11-02 — End: 2017-11-16

## 2016-11-02 NOTE — Progress Notes (Signed)
Subjective:    Patient ID: Michael [name] A Aycock, male    DOB: Jan 19, 1966, 51 y.o.   MRN: [id number]  HPI He is here for complete examination. He has had some intermittent difficulty with left knee pain that has been slowly getting worse. There is been no popping, locking or grinding. Flexion does tend to make this worse. He also notes some difficulty with neck discomfort especially when he turns his head to the left. This is also worse at the end of the work week. He does complain of intermittent aches and pains of other joints as well as intermittent fatigue. He continues to smoke roughly 8 cigarettes per day. He is using OTC meds to help with his reflux symptoms. He continues on Lipitor and having no difficulty with that. He is also taking Ziac for his hypertension. There has been more stress in the house recently as the brother-in-law has moved in who has special needs. He does have underlying allergies and they seem to be under good control. He has had a colonoscopy and does have evidence of tubular adenoma. He is scheduled for another colonoscopy within the next several years.   Review of Systems  All other systems reviewed and are negative.      Objective:   Physical Exam BP 126/86   Pulse 80   Ht 5\' 5"  (1.651 m)   Wt 179 lb (81.2 kg)   SpO2 97%   BMI 29.79 kg/m   General Appearance:    Alert, cooperative, no distress, appears stated age  Head:    Normocephalic, without obvious abnormality, atraumatic  Eyes:    PERRL, conjunctiva/corneas clear, EOM's intact, fundi    benign  Ears:    Normal TM's and external ear canals  Nose:   Nares normal, mucosa normal, no drainage or sinus   tenderness  Throat:   Lips, mucosa, and tongue normal; teeth and gums normal  Neck:   Supple, no lymphadenopathy;  thyroid:  no   enlargement/tenderness/nodules; no carotid   bruit or JVD  Back:    Spine nontender, no curvature, ROM normal, no CVA     tenderness  Lungs:     Clear to auscultation bilaterally  without wheezes, rales or     ronchi; respirations unlabored      Heart:    Regular rate and rhythm, S1 and S2 normal, no murmur, rub   or gallop     Abdomen:     Soft, non-tender, nondistended, normoactive bowel sounds,    no masses, no hepatosplenomegaly  Genitalia:    Normal male external genitalia without lesions.  Testicles without masses.  No inguinal hernias.  Rectal:    Deferred   Extremities:   No clubbing, cyanosis or edema. Left knee exam shows no tenderness or effusion. Medial and lateral collateral ligament intact. Negative anterior drawer. McMurray's testing negative.   Pulses:   2+ and symmetric all extremities  Skin:   Skin color, texture, turgor normal, no rashes or lesions  Lymph nodes:   Cervical, supraclavicular, and axillary nodes normal  Neurologic:   CNII-XII intact, normal strength, sensation and gait; reflexes 2+ and symmetric throughout          Psych:   Normal mood, affect, hygiene and grooming.          Assessment & Plan:  Routine general medical examination at a health care facility - Plan: CBC with Differential/Platelet, Comprehensive metabolic panel, Lipid panel  History of colonic polyps  Left knee pain, unspecified  chronicity - Plan: DG Knee Complete 4 Views Left  Smoker  Essential hypertension - Plan: bisoprolol-hydrochlorothiazide (ZIAC) 10-6.25 MG tablet, CBC with Differential/Platelet, Comprehensive metabolic panel  Hyperlipidemia with target LDL less than 100 - Plan: atorvastatin (LIPITOR) 20 MG tablet, Lipid panel  Gastroesophageal reflux disease without esophagitis  Allergic rhinitis due to pollen, unspecified seasonality He is scheduled for follow-up concerning the polyps. Will get an x-ray on his knee and follow-up pending results of that. At this time he is not ready to quit smoking although he is only smoking roughly 8 cigarettes per day. Continue on his blood pressure medication as well as Lipitor. His reflux and allergies seem to be  under good control with OTC meds.

## 2016-11-04 ENCOUNTER — Ambulatory Visit
Admission: RE | Admit: 2016-11-04 | Discharge: 2016-11-04 | Disposition: A | Discharge status: Home / Self Care | Payer: BLUE CROSS/BLUE SHIELD | Source: Ambulatory Visit | Attending: Family Medicine | Admitting: Family Medicine

## 2016-11-04 DIAGNOSIS — M25562 Pain in left knee: Secondary | ICD-10-CM

## 2017-09-09 ENCOUNTER — Telehealth: Payer: Self-pay | Admitting: Family Medicine

## 2017-09-09 MED ORDER — OSELTAMIVIR PHOSPHATE 75 MG PO CAPS
75.0000 mg | ORAL_CAPSULE | Freq: Every day | ORAL | 0 refills | Status: DC
Start: 2017-09-09 — End: 2022-05-05

## 2017-09-09 NOTE — Telephone Encounter (Signed)
I called it in 

## 2017-09-09 NOTE — Telephone Encounter (Signed)
Pt is aware. [name]

## 2017-09-09 NOTE — Telephone Encounter (Signed)
Pt called and stated his youngest child was just diagnosed with the flu. It was recommended by child's pediatrician that he call for Tamiflu. Pt uses [facility] on [address] and can be reached at [phone].

## 2017-11-02 ENCOUNTER — Other Ambulatory Visit: Payer: Self-pay

## 2017-11-02 DIAGNOSIS — E785 Hyperlipidemia, unspecified: Secondary | ICD-10-CM

## 2017-11-02 MED ORDER — ATORVASTATIN CALCIUM 20 MG PO TABS: 20.0000 mg | ORAL_TABLET | Freq: Every day | ORAL | 3 refills | Status: DC

## 2017-11-16 ENCOUNTER — Ambulatory Visit (INDEPENDENT_AMBULATORY_CARE_PROVIDER_SITE_OTHER): Payer: BLUE CROSS/BLUE SHIELD | Admitting: Family Medicine

## 2017-11-16 ENCOUNTER — Encounter: Payer: Self-pay | Admitting: Family Medicine

## 2017-11-16 VITALS — BP 128/86 | HR 63 | Temp 97.8°F | Ht 67.0 in | Wt 180.8 lb

## 2017-11-16 DIAGNOSIS — I10 Essential (primary) hypertension: Secondary | ICD-10-CM

## 2017-11-16 DIAGNOSIS — F17200 Nicotine dependence, unspecified, uncomplicated: Secondary | ICD-10-CM | POA: Diagnosis not present

## 2017-11-16 DIAGNOSIS — K219 Gastro-esophageal reflux disease without esophagitis: Secondary | ICD-10-CM | POA: Diagnosis not present

## 2017-11-16 DIAGNOSIS — Z860100 Personal history of colon polyps, unspecified: Secondary | ICD-10-CM

## 2017-11-16 DIAGNOSIS — E785 Hyperlipidemia, unspecified: Secondary | ICD-10-CM | POA: Diagnosis not present

## 2017-11-16 DIAGNOSIS — M25562 Pain in left knee: Secondary | ICD-10-CM | POA: Diagnosis not present

## 2017-11-16 DIAGNOSIS — J301 Allergic rhinitis due to pollen: Secondary | ICD-10-CM | POA: Diagnosis not present

## 2017-11-16 MED ORDER — TRIAMCINOLONE ACETONIDE 40 MG/ML IJ SUSP
40.0000 mg | Freq: Once | INTRAMUSCULAR | Status: AC
  Administered 2017-11-16: 40 mg via INTRAMUSCULAR

## 2017-11-16 MED ORDER — ATORVASTATIN CALCIUM 20 MG PO TABS: 20.0000 mg | ORAL_TABLET | Freq: Every day | ORAL | 3 refills | Status: DC

## 2017-11-16 MED ORDER — LIDOCAINE HCL 2 % IJ SOLN
10.0000 mL | Freq: Once | INTRAMUSCULAR | Status: AC
  Administered 2017-11-16: 200 mg via INTRADERMAL

## 2017-11-16 MED ORDER — BISOPROLOL-HYDROCHLOROTHIAZIDE 10-6.25 MG PO TABS: 1.0000 | ORAL_TABLET | Freq: Every day | ORAL | 3 refills | Status: DC

## 2017-11-16 NOTE — Progress Notes (Signed)
   Subjective:    Patient ID: Michael [name], male    DOB: 1965-08-20, 52 y.o.   MRN: [id number]  HPI He is here for my check appointment.  He does have underlying allergies that seem to be doing well.  He is also taking Lipitor and having no difficulty with that.  He is on isopropyl all/hydrochlorothiazide for his blood pressure.  His reflux causes him very little difficulty.  He does have a story of colonic polyps and is scheduled for appropriate follow-up on that.  He continues to smoke and is not interested in quitting.  He has been using CBD oil for help of aches and pains which he thinks does help.  He is also been having left knee pain for over a year now.  It bothers him especially when he stands or does anything physical for a long period of time.  No popping, locking or grinding.   Review of Systems     Objective:   Physical Exam Alert and in no distress. Tympanic membranes and canals are normal. Pharyngeal area is normal. Neck is supple without adenopathy or thyromegaly. Cardiac exam shows a regular sinus rhythm without murmurs or gallops. Lungs are clear to auscultation. Left knee exam shows no effusion.  Anterior drawer negative.  Medial lateral collateral ligaments intact.  McMurray's testing causes discomfort laterally.       Assessment & Plan:  Left knee pain, unspecified chronicity - Plan: lidocaine (XYLOCAINE) 2 % (with pres) injection 200 mg, triamcinolone acetonide (KENALOG-40) injection 40 mg  Hyperlipidemia with target LDL less than 100 - Plan: Lipid panel, atorvastatin (LIPITOR) 20 MG tablet  Essential hypertension - Plan: CBC with Differential/Platelet, Comprehensive metabolic panel, bisoprolol-hydrochlorothiazide (ZIAC) 10-6.25 MG tablet  Allergic rhinitis due to pollen, unspecified seasonality  Gastroesophageal reflux disease without esophagitis  History of colonic polyps  Smoker I discussed the knee pain with him especially in regard to x-rays being -1-year  ago.  Discussed the fact that this could easily be meniscal damage but surgery is not a great option.  Discussed an injection which he was willing to have.  The knee was prepped laterally.  40 mg of Kenalog and 3 cc of Xylocaine was injected into the joint space without difficulty.  He will keep me informed as to how he is doing.  Also discussed possible referral for physical therapy concerning this.

## 2017-11-17 LAB — CBC WITH DIFFERENTIAL/PLATELET
Basophils Absolute: 0 10*3/uL (ref 0.0–0.2)
Basos: 1 %
EOS (ABSOLUTE): 0.2 10*3/uL (ref 0.0–0.4)
Eos: 3 %
Hematocrit: 45.8 % (ref 37.5–51.0)
Hemoglobin: 15.8 g/dL (ref 13.0–17.7)
Immature Grans (Abs): 0 10*3/uL (ref 0.0–0.1)
Immature Granulocytes: 0 %
Lymphocytes Absolute: 1.4 10*3/uL (ref 0.7–3.1)
Lymphs: 21 %
MCH: 31.7 pg (ref 26.6–33.0)
MCHC: 34.5 g/dL (ref 31.5–35.7)
MCV: 92 fL (ref 79–97)
Monocytes Absolute: 0.4 10*3/uL (ref 0.1–0.9)
Monocytes: 7 %
Neutrophils Absolute: 4.6 10*3/uL (ref 1.4–7.0)
Neutrophils: 68 %
Platelets: 205 10*3/uL (ref 150–450)
RBC: 4.99 x10E6/uL (ref 4.14–5.80)
RDW: 13.7 % (ref 12.3–15.4)
WBC: 6.7 10*3/uL (ref 3.4–10.8)

## 2017-11-17 LAB — COMPREHENSIVE METABOLIC PANEL
ALT: 29 IU/L (ref 0–44)
AST: 18 IU/L (ref 0–40)
Albumin/Globulin Ratio: 2 (ref 1.2–2.2)
Albumin: 4.7 g/dL (ref 3.5–5.5)
Alkaline Phosphatase: 110 IU/L (ref 39–117)
BUN/Creatinine Ratio: 17 (ref 9–20)
BUN: 16 mg/dL (ref 6–24)
Bilirubin Total: 0.9 mg/dL (ref 0.0–1.2)
CO2: 23 mmol/L (ref 20–29)
Calcium: 9.4 mg/dL (ref 8.7–10.2)
Chloride: 102 mmol/L (ref 96–106)
Creatinine, Ser: 0.92 mg/dL (ref 0.76–1.27)
GFR calc Af Amer: 112 mL/min/{1.73_m2} (ref >59)
GFR calc non Af Amer: 97 mL/min/{1.73_m2} (ref >59)
Globulin, Total: 2.3 g/dL (ref 1.5–4.5)
Glucose: 94 mg/dL (ref 65–99)
Potassium: 4.6 mmol/L (ref 3.5–5.2)
Sodium: 141 mmol/L (ref 134–144)
Total Protein: 7 g/dL (ref 6.0–8.5)

## 2017-11-17 LAB — LIPID PANEL
Chol/HDL Ratio: 4 ratio (ref 0.0–5.0)
Cholesterol, Total: 143 mg/dL (ref 100–199)
HDL: 36 mg/dL — ABNORMAL LOW (ref >39)
LDL Calculated: 38 mg/dL (ref 0–99)
Triglycerides: 344 mg/dL — ABNORMAL HIGH (ref 0–149)
VLDL Cholesterol Cal: 69 mg/dL — ABNORMAL HIGH (ref 5–40)

## 2018-10-19 ENCOUNTER — Other Ambulatory Visit: Payer: Self-pay | Admitting: Family Medicine

## 2018-10-19 DIAGNOSIS — E785 Hyperlipidemia, unspecified: Secondary | ICD-10-CM

## 2018-11-06 ENCOUNTER — Encounter: Payer: Self-pay | Admitting: Gastroenterology

## 2018-11-10 ENCOUNTER — Other Ambulatory Visit: Payer: Self-pay | Admitting: Family Medicine

## 2018-11-10 DIAGNOSIS — I10 Essential (primary) hypertension: Secondary | ICD-10-CM

## 2019-01-19 ENCOUNTER — Other Ambulatory Visit: Payer: Self-pay | Admitting: Family Medicine

## 2019-01-19 DIAGNOSIS — E785 Hyperlipidemia, unspecified: Secondary | ICD-10-CM

## 2019-02-06 ENCOUNTER — Other Ambulatory Visit: Payer: Self-pay | Admitting: Family Medicine

## 2019-02-06 DIAGNOSIS — I10 Essential (primary) hypertension: Secondary | ICD-10-CM

## 2019-02-22 ENCOUNTER — Telehealth: Payer: Self-pay

## 2019-02-22 NOTE — Telephone Encounter (Signed)
LVM for pt to call back for covid screening questions . [place]

## 2019-02-23 ENCOUNTER — Telehealth: Payer: Self-pay | Admitting: Family Medicine

## 2019-02-28 ENCOUNTER — Ambulatory Visit (INDEPENDENT_AMBULATORY_CARE_PROVIDER_SITE_OTHER): Payer: BC Managed Care – PPO | Admitting: Family Medicine

## 2019-02-28 ENCOUNTER — Other Ambulatory Visit: Payer: Self-pay

## 2019-02-28 ENCOUNTER — Encounter: Payer: Self-pay | Admitting: Family Medicine

## 2019-02-28 VITALS — BP 116/72 | HR 69 | Temp 98.4°F | Ht 67.5 in | Wt 179.0 lb

## 2019-02-28 DIAGNOSIS — I10 Essential (primary) hypertension: Secondary | ICD-10-CM

## 2019-02-28 DIAGNOSIS — E785 Hyperlipidemia, unspecified: Secondary | ICD-10-CM

## 2019-02-28 DIAGNOSIS — Z0000 Encounter for general adult medical examination without abnormal findings: Secondary | ICD-10-CM | POA: Diagnosis not present

## 2019-02-28 DIAGNOSIS — Z23 Encounter for immunization: Secondary | ICD-10-CM | POA: Diagnosis not present

## 2019-02-28 DIAGNOSIS — J301 Allergic rhinitis due to pollen: Secondary | ICD-10-CM

## 2019-02-28 DIAGNOSIS — K219 Gastro-esophageal reflux disease without esophagitis: Secondary | ICD-10-CM | POA: Diagnosis not present

## 2019-02-28 DIAGNOSIS — F17200 Nicotine dependence, unspecified, uncomplicated: Secondary | ICD-10-CM

## 2019-02-28 DIAGNOSIS — Z86010 Personal history of colonic polyps: Secondary | ICD-10-CM

## 2019-02-28 DIAGNOSIS — M1990 Unspecified osteoarthritis, unspecified site: Secondary | ICD-10-CM | POA: Insufficient documentation

## 2019-02-28 MED ORDER — BISOPROLOL-HYDROCHLOROTHIAZIDE 10-6.25 MG PO TABS: 1.0000 | ORAL_TABLET | Freq: Every day | ORAL | 3 refills | Status: DC

## 2019-02-28 MED ORDER — ATORVASTATIN CALCIUM 20 MG PO TABS: ORAL_TABLET | ORAL | 3 refills | Status: DC

## 2019-02-28 NOTE — Progress Notes (Signed)
   Subjective:    Patient ID: Michael [name] A Boster, male    DOB: 1965/12/25, 53 y.o.   MRN: [id number]  HPI He is here for complete examination.  He does have occasional difficulty with left knee pain and has had previous injections for that.  He has also had cervical disc surgery and does complain of some neck discomfort as well.  His allergies seem to be under good control.  He does have reflux disease and is having minimal trouble with that.  Continues on his blood pressure medication as well as atorvastatin.  He smokes but is not interested in quitting smoking.  He does have a history of colonic polyps and is on a 5-year schedule for repeat.  His marriage and home life are going well.  Family and social history as well as health maintenance and immunizations was reviewed.   Review of Systems  All other systems reviewed and are negative.      Objective:   Physical Exam Alert and in no distress. Tympanic membranes and canals are normal. Pharyngeal area is normal. Neck is supple without adenopathy or thyromegaly. Cardiac exam shows a regular sinus rhythm without murmurs or gallops. Lungs are clear to auscultation. Abdominal exam shows no masses or tenderness with normal bowel sounds       Assessment & Plan:  Routine general medical examination at a health care facility - Plan: CBC with Differential/Platelet, Comprehensive metabolic panel, Lipid panel  Smoker  Essential hypertension - Plan: CBC with Differential/Platelet, Comprehensive metabolic panel, bisoprolol-hydrochlorothiazide (ZIAC) 10-6.25 MG tablet  Hyperlipidemia with target LDL less than 100 - Plan: Lipid panel, atorvastatin (LIPITOR) 20 MG tablet  Gastroesophageal reflux disease without esophagitis  Allergic rhinitis due to pollen, unspecified seasonality  History of colonic polyps  Need for Tdap vaccination - Plan: Tdap vaccine greater than or equal to 7yo IM  Need for influenza vaccination - Plan: Flu Vaccine QUAD 6+ mos PF  IM (Fluarix Quad PF)  Arthritis  Discussed treatment of the arthritis with him and at this point would continue to recommend conservative care.  He is comfortable with that.  Encouraged him to stay physically active.  Treat the reflux and allergies as needed.  Follow-up with GI.

## 2019-03-01 LAB — COMPREHENSIVE METABOLIC PANEL
ALT: 33 IU/L (ref 0–44)
AST: 20 IU/L (ref 0–40)
Albumin/Globulin Ratio: 2.1 (ref 1.2–2.2)
Albumin: 4.6 g/dL (ref 3.8–4.9)
Alkaline Phosphatase: 110 IU/L (ref 39–117)
BUN/Creatinine Ratio: 16 (ref 9–20)
BUN: 18 mg/dL (ref 6–24)
Bilirubin Total: 0.6 mg/dL (ref 0.0–1.2)
CO2: 26 mmol/L (ref 20–29)
Calcium: 9.6 mg/dL (ref 8.7–10.2)
Chloride: 102 mmol/L (ref 96–106)
Creatinine, Ser: 1.1 mg/dL (ref 0.76–1.27)
GFR calc Af Amer: 89 mL/min/{1.73_m2} (ref >59)
GFR calc non Af Amer: 77 mL/min/{1.73_m2} (ref >59)
Globulin, Total: 2.2 g/dL (ref 1.5–4.5)
Glucose: 97 mg/dL (ref 65–99)
Potassium: 4.2 mmol/L (ref 3.5–5.2)
Sodium: 143 mmol/L (ref 134–144)
Total Protein: 6.8 g/dL (ref 6.0–8.5)

## 2019-03-01 LAB — CBC WITH DIFFERENTIAL/PLATELET
Basophils Absolute: 0.1 10*3/uL (ref 0.0–0.2)
Basos: 2 %
EOS (ABSOLUTE): 0.5 10*3/uL — ABNORMAL HIGH (ref 0.0–0.4)
Eos: 6 %
Hematocrit: 48.5 % (ref 37.5–51.0)
Hemoglobin: 16.3 g/dL (ref 13.0–17.7)
Immature Grans (Abs): 0 10*3/uL (ref 0.0–0.1)
Immature Granulocytes: 0 %
Lymphocytes Absolute: 1.6 10*3/uL (ref 0.7–3.1)
Lymphs: 19 %
MCH: 30.5 pg (ref 26.6–33.0)
MCHC: 33.6 g/dL (ref 31.5–35.7)
MCV: 91 fL (ref 79–97)
Monocytes Absolute: 0.5 10*3/uL (ref 0.1–0.9)
Monocytes: 7 %
Neutrophils Absolute: 5.5 10*3/uL (ref 1.4–7.0)
Neutrophils: 66 %
Platelets: 191 10*3/uL (ref 150–450)
RBC: 5.35 x10E6/uL (ref 4.14–5.80)
RDW: 12.3 % (ref 11.6–15.4)
WBC: 8.3 10*3/uL (ref 3.4–10.8)

## 2019-03-01 LAB — LIPID PANEL
Chol/HDL Ratio: 3.8 ratio (ref 0.0–5.0)
Cholesterol, Total: 146 mg/dL (ref 100–199)
HDL: 38 mg/dL — ABNORMAL LOW (ref >39)
LDL Chol Calc (NIH): 72 mg/dL (ref 0–99)
Triglycerides: 219 mg/dL — ABNORMAL HIGH (ref 0–149)
VLDL Cholesterol Cal: 36 mg/dL (ref 5–40)

## 2019-03-02 ENCOUNTER — Encounter: Payer: Self-pay | Admitting: Family Medicine

## 2019-04-17 ENCOUNTER — Other Ambulatory Visit: Payer: Self-pay | Admitting: Family Medicine

## 2019-04-17 DIAGNOSIS — E785 Hyperlipidemia, unspecified: Secondary | ICD-10-CM

## 2020-04-04 ENCOUNTER — Other Ambulatory Visit: Payer: Self-pay | Admitting: Family Medicine

## 2020-04-04 DIAGNOSIS — E785 Hyperlipidemia, unspecified: Secondary | ICD-10-CM

## 2020-04-04 NOTE — Telephone Encounter (Signed)
Pt not allergic to this med, been taking it for 20 years he said. Scheduled cpe on November 2nd. Does not need refills at this time

## 2020-04-28 ENCOUNTER — Other Ambulatory Visit: Payer: Self-pay | Admitting: Family Medicine

## 2020-04-28 DIAGNOSIS — I10 Essential (primary) hypertension: Secondary | ICD-10-CM

## 2020-04-29 ENCOUNTER — Other Ambulatory Visit: Payer: Self-pay

## 2020-04-29 ENCOUNTER — Ambulatory Visit (INDEPENDENT_AMBULATORY_CARE_PROVIDER_SITE_OTHER): Payer: BC Managed Care – PPO | Admitting: Family Medicine

## 2020-04-29 ENCOUNTER — Encounter: Payer: Self-pay | Admitting: Family Medicine

## 2020-04-29 VITALS — BP 134/90 | HR 63 | Temp 97.2°F | Ht 66.0 in | Wt 185.0 lb

## 2020-04-29 DIAGNOSIS — I10 Essential (primary) hypertension: Secondary | ICD-10-CM | POA: Diagnosis not present

## 2020-04-29 DIAGNOSIS — Z23 Encounter for immunization: Secondary | ICD-10-CM | POA: Diagnosis not present

## 2020-04-29 DIAGNOSIS — Z0000 Encounter for general adult medical examination without abnormal findings: Secondary | ICD-10-CM | POA: Diagnosis not present

## 2020-04-29 DIAGNOSIS — F17200 Nicotine dependence, unspecified, uncomplicated: Secondary | ICD-10-CM | POA: Diagnosis not present

## 2020-04-29 DIAGNOSIS — E785 Hyperlipidemia, unspecified: Secondary | ICD-10-CM

## 2020-04-29 DIAGNOSIS — F439 Reaction to severe stress, unspecified: Secondary | ICD-10-CM

## 2020-04-29 DIAGNOSIS — M1990 Unspecified osteoarthritis, unspecified site: Secondary | ICD-10-CM

## 2020-04-29 DIAGNOSIS — K219 Gastro-esophageal reflux disease without esophagitis: Secondary | ICD-10-CM

## 2020-04-29 DIAGNOSIS — J301 Allergic rhinitis due to pollen: Secondary | ICD-10-CM

## 2020-04-29 DIAGNOSIS — Z1159 Encounter for screening for other viral diseases: Secondary | ICD-10-CM | POA: Diagnosis not present

## 2020-04-29 DIAGNOSIS — Z86010 Personal history of colonic polyps: Secondary | ICD-10-CM

## 2020-04-29 LAB — CBC WITH DIFFERENTIAL/PLATELET
EOS (ABSOLUTE): 0.4 10*3/uL (ref 0.0–0.4)
Lymphs: 20 %
Monocytes: 6 %
RDW: 12.6 % (ref 11.6–15.4)
WBC: 9 10*3/uL (ref 3.4–10.8)

## 2020-04-29 LAB — COMPREHENSIVE METABOLIC PANEL

## 2020-04-29 MED ORDER — BISOPROLOL-HYDROCHLOROTHIAZIDE 10-6.25 MG PO TABS
1.0000 | ORAL_TABLET | Freq: Every day | ORAL | 3 refills | Status: DC
Start: 2020-04-29 — End: 2021-05-04

## 2020-04-29 MED ORDER — ATORVASTATIN CALCIUM 20 MG PO TABS: 20.0000 mg | ORAL_TABLET | Freq: Every day | ORAL | 3 refills | Status: DC

## 2020-04-29 NOTE — Progress Notes (Signed)
   Subjective:    Patient ID: Michael [name], male    DOB: 1966/03/30, 54 y.o.   MRN: [id number]  HPI He is here for complete examination.  He does have hyperlipidemia and is taking Lipitor without trouble.  He also continues on Ziac for his blood pressure.  He does have a history of colonic polyps and did not get scheduled last year for follow-up colonoscopy.  He would likely switch to a different gastroenterologist.  He does have underlying reflux but is presently not on any medications.  His allergies seem to be under good control.  He also complains of arthritis but has not been taking any meds for that as it is not that significant.  He is under a lot of stress dealing with his mother that is dying, interacting with his brothers and difficulty at home with his wife.  His work seems to be going well.  Otherwise his family and social history as well as health maintenance and immunizations was reviewed.   Review of Systems  All other systems reviewed and are negative.      Objective:   Physical Exam Alert and in no distress. Tympanic membranes and canals are normal. Pharyngeal area is normal. Neck is supple without adenopathy or thyromegaly. Cardiac exam shows a regular sinus rhythm without murmurs or gallops. Lungs are clear to auscultation. Abdominal exam shows no masses or tenderness.      Assessment & Plan:   Routine general medical examination at a health care facility - Plan: CBC with Differential/Platelet, Comprehensive metabolic panel, Lipid panel  Stress at home  Smoker  Essential hypertension - Plan: bisoprolol-hydrochlorothiazide (ZIAC) 10-6.25 MG tablet  Hyperlipidemia with target LDL less than 100 - Plan: atorvastatin (LIPITOR) 20 MG tablet  Gastroesophageal reflux disease without esophagitis  Allergic rhinitis due to pollen, unspecified seasonality  History of colonic polyps - Plan: Ambulatory referral to Gastroenterology  Need for influenza vaccination - Plan: Flu  Vaccine QUAD 36+ mos IM  Arthritis  Need for hepatitis C screening test - Plan: Hepatitis C antibody I discussed the stress he is having at home and dealing with his brothers and strongly encouraged him to get involved in counseling for this. He is not interested in quitting smoking. He is not having any difficulty with reflux and therefore no intervention needed.  Treat his allergies symptomatically.  Recommend using Tylenol for arthritis with it becomes a problem.

## 2020-04-29 NOTE — Patient Instructions (Signed)
Call [facility] [phone]

## 2020-04-30 LAB — LIPID PANEL
Chol/HDL Ratio: 5 ratio (ref 0.0–5.0)
Cholesterol, Total: 190 mg/dL (ref 100–199)
HDL: 38 mg/dL — ABNORMAL LOW (ref >39)
LDL Chol Calc (NIH): 87 mg/dL (ref 0–99)
Triglycerides: 401 mg/dL — ABNORMAL HIGH (ref 0–149)
VLDL Cholesterol Cal: 65 mg/dL — ABNORMAL HIGH (ref 5–40)

## 2020-04-30 LAB — COMPREHENSIVE METABOLIC PANEL
ALT: 36 IU/L (ref 0–44)
AST: 19 IU/L (ref 0–40)
Albumin/Globulin Ratio: 1.7 (ref 1.2–2.2)
Albumin: 4.7 g/dL (ref 3.8–4.9)
Alkaline Phosphatase: 119 IU/L (ref 44–121)
BUN/Creatinine Ratio: 16 (ref 9–20)
BUN: 16 mg/dL (ref 6–24)
Bilirubin Total: 0.6 mg/dL (ref 0.0–1.2)
CO2: 27 mmol/L (ref 20–29)
Calcium: 9.7 mg/dL (ref 8.7–10.2)
Chloride: 99 mmol/L (ref 96–106)
Creatinine, Ser: 1.03 mg/dL (ref 0.76–1.27)
GFR calc Af Amer: 95 mL/min/{1.73_m2} (ref >59)
GFR calc non Af Amer: 83 mL/min/{1.73_m2} (ref >59)
Globulin, Total: 2.7 g/dL (ref 1.5–4.5)
Glucose: 103 mg/dL — ABNORMAL HIGH (ref 65–99)
Total Protein: 7.4 g/dL (ref 6.0–8.5)

## 2020-04-30 LAB — CBC WITH DIFFERENTIAL/PLATELET
Basophils Absolute: 0.2 10*3/uL (ref 0.0–0.2)
Basos: 2 %
Eos: 4 %
Hematocrit: 48.7 % (ref 37.5–51.0)
Hemoglobin: 16.9 g/dL (ref 13.0–17.7)
Immature Grans (Abs): 0 10*3/uL (ref 0.0–0.1)
Immature Granulocytes: 0 %
Lymphocytes Absolute: 1.8 10*3/uL (ref 0.7–3.1)
MCH: 31.8 pg (ref 26.6–33.0)
MCHC: 34.7 g/dL (ref 31.5–35.7)
MCV: 92 fL (ref 79–97)
Monocytes Absolute: 0.6 10*3/uL (ref 0.1–0.9)
Neutrophils Absolute: 6.1 10*3/uL (ref 1.4–7.0)
Neutrophils: 68 %
Platelets: 207 10*3/uL (ref 150–450)
RBC: 5.32 x10E6/uL (ref 4.14–5.80)

## 2020-04-30 LAB — HEPATITIS C ANTIBODY: Hep C Virus Ab: 0.2 s/co ratio (ref 0.0–0.9)

## 2020-05-07 DIAGNOSIS — Z1211 Encounter for screening for malignant neoplasm of colon: Secondary | ICD-10-CM | POA: Diagnosis not present

## 2020-10-27 ENCOUNTER — Encounter: Payer: Self-pay | Admitting: Family Medicine

## 2020-10-27 ENCOUNTER — Other Ambulatory Visit: Payer: Self-pay

## 2020-10-27 ENCOUNTER — Ambulatory Visit (INDEPENDENT_AMBULATORY_CARE_PROVIDER_SITE_OTHER): Payer: BC Managed Care – PPO | Admitting: Family Medicine

## 2020-10-27 VITALS — BP 130/82 | HR 67 | Temp 98.0°F | Wt 180.6 lb

## 2020-10-27 DIAGNOSIS — E785 Hyperlipidemia, unspecified: Secondary | ICD-10-CM | POA: Diagnosis not present

## 2020-10-27 DIAGNOSIS — Z860100 Personal history of colon polyps, unspecified: Secondary | ICD-10-CM

## 2020-10-27 LAB — LIPID PANEL
Chol/HDL Ratio: 3.7 ratio (ref 0.0–5.0)
Cholesterol, Total: 139 mg/dL (ref 100–199)
HDL: 38 mg/dL — ABNORMAL LOW (ref >39)
LDL Chol Calc (NIH): 66 mg/dL (ref 0–99)
Triglycerides: 216 mg/dL — ABNORMAL HIGH (ref 0–149)
VLDL Cholesterol Cal: 35 mg/dL (ref 5–40)

## 2020-10-27 NOTE — Progress Notes (Signed)
   Subjective:    Patient ID: Michael [name] A Wade, male    DOB: 21-Dec-1965, 55 y.o.   MRN: [id number]  HPI He is here for recheck on his triglycerides.  He continues on Lipitor and is having no difficulty with that.  He has made some dietary changes specifically cutting back on carbohydrates. Review of his record also indicates need for colonoscopy.  He has a history of tubular adenoma and does need follow-up concerning that.   Review of Systems     Objective:   Physical Exam Alert and in no distress otherwise not examined       Assessment & Plan:  Hyperlipidemia with target LDL less than 100 - Plan: Lipid panel  History of colonic polyps - Tubular adenoma He will also set up for colonoscopy.

## 2020-11-22 DIAGNOSIS — I10 Essential (primary) hypertension: Secondary | ICD-10-CM | POA: Diagnosis not present

## 2020-11-22 DIAGNOSIS — Z6828 Body mass index (BMI) 28.0-28.9, adult: Secondary | ICD-10-CM | POA: Diagnosis not present

## 2020-11-22 DIAGNOSIS — K047 Periapical abscess without sinus: Secondary | ICD-10-CM | POA: Diagnosis not present

## 2020-11-22 DIAGNOSIS — Z720 Tobacco use: Secondary | ICD-10-CM | POA: Diagnosis not present

## 2021-01-20 DIAGNOSIS — Z1211 Encounter for screening for malignant neoplasm of colon: Secondary | ICD-10-CM | POA: Diagnosis not present

## 2021-01-20 DIAGNOSIS — K635 Polyp of colon: Secondary | ICD-10-CM | POA: Diagnosis not present

## 2021-01-20 DIAGNOSIS — D123 Benign neoplasm of transverse colon: Secondary | ICD-10-CM | POA: Diagnosis not present

## 2021-01-20 DIAGNOSIS — D124 Benign neoplasm of descending colon: Secondary | ICD-10-CM | POA: Diagnosis not present

## 2021-01-20 LAB — HM COLONOSCOPY

## 2021-01-26 DIAGNOSIS — D126 Benign neoplasm of colon, unspecified: Secondary | ICD-10-CM | POA: Insufficient documentation

## 2021-01-28 ENCOUNTER — Encounter: Payer: Self-pay | Admitting: Family Medicine

## 2021-05-04 ENCOUNTER — Encounter: Payer: Self-pay | Admitting: Family Medicine

## 2021-05-04 ENCOUNTER — Ambulatory Visit (INDEPENDENT_AMBULATORY_CARE_PROVIDER_SITE_OTHER): Payer: BC Managed Care – PPO | Admitting: Family Medicine

## 2021-05-04 ENCOUNTER — Other Ambulatory Visit: Payer: Self-pay | Admitting: Family Medicine

## 2021-05-04 ENCOUNTER — Other Ambulatory Visit: Payer: Self-pay

## 2021-05-04 VITALS — BP 126/84 | HR 65 | Temp 97.3°F | Ht 66.0 in | Wt 176.6 lb

## 2021-05-04 DIAGNOSIS — F17200 Nicotine dependence, unspecified, uncomplicated: Secondary | ICD-10-CM | POA: Diagnosis not present

## 2021-05-04 DIAGNOSIS — E785 Hyperlipidemia, unspecified: Secondary | ICD-10-CM

## 2021-05-04 DIAGNOSIS — Z0000 Encounter for general adult medical examination without abnormal findings: Secondary | ICD-10-CM | POA: Diagnosis not present

## 2021-05-04 DIAGNOSIS — E781 Pure hyperglyceridemia: Secondary | ICD-10-CM

## 2021-05-04 DIAGNOSIS — D126 Benign neoplasm of colon, unspecified: Secondary | ICD-10-CM

## 2021-05-04 DIAGNOSIS — M1990 Unspecified osteoarthritis, unspecified site: Secondary | ICD-10-CM

## 2021-05-04 DIAGNOSIS — I10 Essential (primary) hypertension: Secondary | ICD-10-CM

## 2021-05-04 DIAGNOSIS — K219 Gastro-esophageal reflux disease without esophagitis: Secondary | ICD-10-CM | POA: Diagnosis not present

## 2021-05-04 DIAGNOSIS — Z9889 Other specified postprocedural states: Secondary | ICD-10-CM | POA: Insufficient documentation

## 2021-05-04 DIAGNOSIS — Z23 Encounter for immunization: Secondary | ICD-10-CM

## 2021-05-04 MED ORDER — BISOPROLOL-HYDROCHLOROTHIAZIDE 10-6.25 MG PO TABS
1.0000 | ORAL_TABLET | Freq: Every day | ORAL | 3 refills | Status: DC
Start: 2021-05-04 — End: 2021-06-08

## 2021-05-04 MED ORDER — ATORVASTATIN CALCIUM 20 MG PO TABS: ORAL_TABLET | ORAL | 3 refills | Status: DC

## 2021-05-04 NOTE — Progress Notes (Signed)
   Subjective:    Patient ID: Michael [name], male    DOB: January 08, 1966, 55 y.o.   MRN: [id number]  HPI He is here for complete examination.  He has had a colonoscopy which did show tubular adenoma and he is scheduled for recheck in 3 years.  He does complain of some arthritic symptoms however the symptoms usually go away fairly quickly after stretching exercises.  Does have a previous history of cervical disc surgery.  He does have reflux disease but rarely has trouble and does not take any present medications.  He did have COVID in late August and sees be doing well from this.  Continues on his atorvastatin.  He was given originally mainly for his triglycerides.  He seems to be tolerating that medication well.  He smokes but is not interested in quitting.  His work and home life are going well.    Review of Systems  All other systems reviewed and are negative.     Objective:   Physical Exam Alert and in no distress. Tympanic membranes and canals are normal. Pharyngeal area is normal. Neck is supple without adenopathy or thyromegaly. Cardiac exam shows a regular sinus rhythm without murmurs or gallops. Lungs are clear to auscultation.        Assessment & Plan:  Routine general medical examination at a health care facility - Plan: CBC with Differential/Platelet, Comprehensive metabolic panel, Lipid panel  Need for influenza vaccination - Plan: Flu Vaccine QUAD 56mo+IM (Fluarix, Fluzone & Alfiuria Quad PF)  Tubular adenoma of colon  Smoker  Arthritis  Hypertriglyceridemia - Plan: Lipid panel  Gastroesophageal reflux disease without esophagitis  History of cervical discectomy - 2014  Essential hypertension - Plan: bisoprolol-hydrochlorothiazide (ZIAC) 10-6.25 MG tablet  Hyperlipidemia with target LDL less than 100 - Plan: atorvastatin (LIPITOR) 20 MG tablet Smoking cessation discussed with him however is not interested in quitting. Continue on his present medication  regimen. Follow-up with gastroenterology as needed. Discussed the arthritis and basically recommended conservative care for that.  He is comfortable with that.

## 2021-05-05 LAB — CBC WITH DIFFERENTIAL/PLATELET
Basophils Absolute: 0.1 10*3/uL (ref 0.0–0.2)
Basos: 1 %
EOS (ABSOLUTE): 0.2 10*3/uL (ref 0.0–0.4)
Eos: 2 %
Hematocrit: 47.4 % (ref 37.5–51.0)
Hemoglobin: 16.2 g/dL (ref 13.0–17.7)
Immature Grans (Abs): 0 10*3/uL (ref 0.0–0.1)
Immature Granulocytes: 0 %
Lymphocytes Absolute: 1.7 10*3/uL (ref 0.7–3.1)
Lymphs: 17 %
MCH: 31.2 pg (ref 26.6–33.0)
MCHC: 34.2 g/dL (ref 31.5–35.7)
MCV: 91 fL (ref 79–97)
Monocytes Absolute: 0.5 10*3/uL (ref 0.1–0.9)
Monocytes: 5 %
Neutrophils Absolute: 7.5 10*3/uL — ABNORMAL HIGH (ref 1.4–7.0)
Neutrophils: 75 %
Platelets: 196 10*3/uL (ref 150–450)
RBC: 5.2 x10E6/uL (ref 4.14–5.80)
RDW: 12.2 % (ref 11.6–15.4)
WBC: 9.9 10*3/uL (ref 3.4–10.8)

## 2021-05-05 LAB — COMPREHENSIVE METABOLIC PANEL
ALT: 17 IU/L (ref 0–44)
AST: 18 IU/L (ref 0–40)
Albumin/Globulin Ratio: 2.2 (ref 1.2–2.2)
Albumin: 4.7 g/dL (ref 3.8–4.9)
Alkaline Phosphatase: 106 IU/L (ref 44–121)
BUN/Creatinine Ratio: 16 (ref 9–20)
BUN: 16 mg/dL (ref 6–24)
Bilirubin Total: 0.8 mg/dL (ref 0.0–1.2)
CO2: 27 mmol/L (ref 20–29)
Calcium: 9.2 mg/dL (ref 8.7–10.2)
Chloride: 101 mmol/L (ref 96–106)
Creatinine, Ser: 0.98 mg/dL (ref 0.76–1.27)
Globulin, Total: 2.1 g/dL (ref 1.5–4.5)
Glucose: 91 mg/dL (ref 70–99)
Potassium: 4 mmol/L (ref 3.5–5.2)
Sodium: 137 mmol/L (ref 134–144)
Total Protein: 6.8 g/dL (ref 6.0–8.5)
eGFR: 92 mL/min/{1.73_m2} (ref >59)

## 2021-05-05 LAB — LIPID PANEL
Chol/HDL Ratio: 3.7 ratio (ref 0.0–5.0)
Cholesterol, Total: 144 mg/dL (ref 100–199)
HDL: 39 mg/dL — ABNORMAL LOW (ref >39)
LDL Chol Calc (NIH): 69 mg/dL (ref 0–99)
Triglycerides: 216 mg/dL — ABNORMAL HIGH (ref 0–149)
VLDL Cholesterol Cal: 36 mg/dL (ref 5–40)

## 2021-06-07 ENCOUNTER — Other Ambulatory Visit: Payer: Self-pay | Admitting: Family Medicine

## 2021-06-07 DIAGNOSIS — I10 Essential (primary) hypertension: Secondary | ICD-10-CM

## 2021-10-30 ENCOUNTER — Other Ambulatory Visit: Payer: Self-pay | Admitting: Family Medicine

## 2021-10-30 DIAGNOSIS — E785 Hyperlipidemia, unspecified: Secondary | ICD-10-CM

## 2021-11-25 ENCOUNTER — Other Ambulatory Visit: Payer: Self-pay | Admitting: Family Medicine

## 2021-11-25 DIAGNOSIS — I10 Essential (primary) hypertension: Secondary | ICD-10-CM

## 2022-03-03 ENCOUNTER — Encounter: Payer: Self-pay | Admitting: Internal Medicine

## 2022-04-19 ENCOUNTER — Encounter: Payer: Self-pay | Admitting: Internal Medicine

## 2022-05-05 ENCOUNTER — Ambulatory Visit (INDEPENDENT_AMBULATORY_CARE_PROVIDER_SITE_OTHER): Payer: BC Managed Care – PPO | Admitting: Family Medicine

## 2022-05-05 ENCOUNTER — Encounter: Payer: Self-pay | Admitting: Family Medicine

## 2022-05-05 VITALS — BP 120/80 | HR 67 | Ht 67.25 in | Wt 183.2 lb

## 2022-05-05 DIAGNOSIS — Z23 Encounter for immunization: Secondary | ICD-10-CM | POA: Diagnosis not present

## 2022-05-05 DIAGNOSIS — M1990 Unspecified osteoarthritis, unspecified site: Secondary | ICD-10-CM | POA: Diagnosis not present

## 2022-05-05 DIAGNOSIS — J301 Allergic rhinitis due to pollen: Secondary | ICD-10-CM

## 2022-05-05 DIAGNOSIS — M25562 Pain in left knee: Secondary | ICD-10-CM

## 2022-05-05 DIAGNOSIS — D126 Benign neoplasm of colon, unspecified: Secondary | ICD-10-CM | POA: Diagnosis not present

## 2022-05-05 DIAGNOSIS — E785 Hyperlipidemia, unspecified: Secondary | ICD-10-CM

## 2022-05-05 DIAGNOSIS — F17200 Nicotine dependence, unspecified, uncomplicated: Secondary | ICD-10-CM

## 2022-05-05 DIAGNOSIS — I10 Essential (primary) hypertension: Secondary | ICD-10-CM | POA: Diagnosis not present

## 2022-05-05 DIAGNOSIS — G8929 Other chronic pain: Secondary | ICD-10-CM

## 2022-05-05 DIAGNOSIS — Z0000 Encounter for general adult medical examination without abnormal findings: Secondary | ICD-10-CM

## 2022-05-05 DIAGNOSIS — Z9889 Other specified postprocedural states: Secondary | ICD-10-CM

## 2022-05-05 DIAGNOSIS — K219 Gastro-esophageal reflux disease without esophagitis: Secondary | ICD-10-CM

## 2022-05-05 MED ORDER — BISOPROLOL-HYDROCHLOROTHIAZIDE 10-6.25 MG PO TABS: 1.0000 | ORAL_TABLET | Freq: Every day | ORAL | 1 refills | Status: DC

## 2022-05-05 MED ORDER — ATORVASTATIN CALCIUM 20 MG PO TABS: 20.0000 mg | ORAL_TABLET | Freq: Every day | ORAL | 3 refills | Status: DC

## 2022-05-05 NOTE — Progress Notes (Signed)
   Subjective:    Patient ID: Michael [name], male    DOB: July 21, 1965, 56 y.o.   MRN: [id number]  HPI He is here for complete examination.  He has had intermittent left knee pain that bothers him especially if he flexes his knee but states that he is able to function doing his ADLs without difficulty.  No popping, locking or grinding.  He continues on his blood pressure medication as well as atorvastatin and is having no difficulty with that.  He has had a colonoscopy and is scheduled for repeat colonoscopy due to polyps.  He is having no difficulty from his allergies.He does have reflux disease but but has very little difficulty with it.  He smokes but is not interested in quitting.  He has had previous neck surgery but is having no trouble with his neck.  Otherwise his family and social history as well as health maintenance and immunizations was reviewed.   Review of Systems  All other systems reviewed and are negative.      Objective:   Physical Exam Alert and in no distress. Tympanic membranes and canals are normal. Pharyngeal area is normal. Neck is supple without adenopathy or thyromegaly. Cardiac exam shows a regular sinus rhythm without murmurs or gallops. Lungs are clear to auscultation. Left knee exam shows no palpable tenderness.  Negative anterior drawer.  McMurray's testing negative.  No tenderness over joint lines.       Assessment & Plan:  Routine general medical examination at a health care facility - Plan: CBC with Differential/Platelet, Comprehensive metabolic panel, Lipid panel  Allergic rhinitis due to pollen, unspecified seasonality  Gastroesophageal reflux disease without esophagitis  Tubular adenoma of colon  Arthritis  History of cervical discectomy  Smoker  Hyperlipidemia with target LDL less than 100 - Plan: atorvastatin (LIPITOR) 20 MG tablet, Lipid panel  Essential hypertension - Plan: bisoprolol-hydrochlorothiazide (ZIAC) 10-6.25 MG tablet, CBC with  Differential/Platelet, Comprehensive metabolic panel Discussed treatment of his knee in terms of rehab and at the present time he is really not interested in that.  Continue on his present medication regimen.  Continue to treat his allergies as needed as well as his reflux disease.  Briefly discussed smoking cessation but he is not interested.

## 2022-05-06 LAB — LIPID PANEL
Chol/HDL Ratio: 3.7 ratio (ref 0.0–5.0)
Cholesterol, Total: 150 mg/dL (ref 100–199)
HDL: 41 mg/dL (ref >39)
LDL Chol Calc (NIH): 67 mg/dL (ref 0–99)
Triglycerides: 264 mg/dL — ABNORMAL HIGH (ref 0–149)
VLDL Cholesterol Cal: 42 mg/dL — ABNORMAL HIGH (ref 5–40)

## 2022-05-06 LAB — COMPREHENSIVE METABOLIC PANEL
ALT: 32 IU/L (ref 0–44)
AST: 26 IU/L (ref 0–40)
Albumin/Globulin Ratio: 2.1 (ref 1.2–2.2)
Albumin: 4.6 g/dL (ref 3.8–4.9)
Alkaline Phosphatase: 116 IU/L (ref 44–121)
BUN/Creatinine Ratio: 18 (ref 9–20)
BUN: 17 mg/dL (ref 6–24)
Bilirubin Total: 0.7 mg/dL (ref 0.0–1.2)
CO2: 22 mmol/L (ref 20–29)
Calcium: 9.3 mg/dL (ref 8.7–10.2)
Chloride: 101 mmol/L (ref 96–106)
Creatinine, Ser: 0.94 mg/dL (ref 0.76–1.27)
Globulin, Total: 2.2 g/dL (ref 1.5–4.5)
Glucose: 95 mg/dL (ref 70–99)
Potassium: 4.1 mmol/L (ref 3.5–5.2)
Sodium: 138 mmol/L (ref 134–144)
Total Protein: 6.8 g/dL (ref 6.0–8.5)
eGFR: 96 mL/min/{1.73_m2} (ref >59)

## 2022-05-06 LAB — CBC WITH DIFFERENTIAL/PLATELET
Basophils Absolute: 0.1 10*3/uL (ref 0.0–0.2)
Basos: 1 %
EOS (ABSOLUTE): 0.3 10*3/uL (ref 0.0–0.4)
Eos: 4 %
Hematocrit: 48.5 % (ref 37.5–51.0)
Hemoglobin: 16.4 g/dL (ref 13.0–17.7)
Immature Grans (Abs): 0 10*3/uL (ref 0.0–0.1)
Immature Granulocytes: 0 %
Lymphocytes Absolute: 1.4 10*3/uL (ref 0.7–3.1)
Lymphs: 22 %
MCH: 31.7 pg (ref 26.6–33.0)
MCHC: 33.8 g/dL (ref 31.5–35.7)
MCV: 94 fL (ref 79–97)
Monocytes Absolute: 0.4 10*3/uL (ref 0.1–0.9)
Monocytes: 7 %
Neutrophils Absolute: 4.3 10*3/uL (ref 1.4–7.0)
Neutrophils: 66 %
Platelets: 205 10*3/uL (ref 150–450)
RBC: 5.18 x10E6/uL (ref 4.14–5.80)
RDW: 12.5 % (ref 11.6–15.4)
WBC: 6.6 10*3/uL (ref 3.4–10.8)

## 2022-05-10 ENCOUNTER — Encounter: Payer: BC Managed Care – PPO | Admitting: Family Medicine

## 2022-06-18 ENCOUNTER — Other Ambulatory Visit: Payer: Self-pay | Admitting: Family Medicine

## 2022-06-18 DIAGNOSIS — I10 Essential (primary) hypertension: Secondary | ICD-10-CM

## 2022-07-29 ENCOUNTER — Other Ambulatory Visit: Payer: Self-pay | Admitting: Family Medicine

## 2022-07-29 DIAGNOSIS — E785 Hyperlipidemia, unspecified: Secondary | ICD-10-CM

## 2022-09-14 DIAGNOSIS — H40053 Ocular hypertension, bilateral: Secondary | ICD-10-CM | POA: Diagnosis not present

## 2022-09-14 DIAGNOSIS — H33322 Round hole, left eye: Secondary | ICD-10-CM | POA: Diagnosis not present

## 2022-09-14 DIAGNOSIS — H33011 Retinal detachment with single break, right eye: Secondary | ICD-10-CM | POA: Diagnosis not present

## 2022-09-16 DIAGNOSIS — H33322 Round hole, left eye: Secondary | ICD-10-CM | POA: Diagnosis not present

## 2022-09-16 DIAGNOSIS — H33021 Retinal detachment with multiple breaks, right eye: Secondary | ICD-10-CM | POA: Diagnosis not present

## 2022-09-16 DIAGNOSIS — H33332 Multiple defects of retina without detachment, left eye: Secondary | ICD-10-CM | POA: Diagnosis not present

## 2022-09-17 DIAGNOSIS — H33011 Retinal detachment with single break, right eye: Secondary | ICD-10-CM | POA: Diagnosis not present

## 2022-09-17 DIAGNOSIS — Z9889 Other specified postprocedural states: Secondary | ICD-10-CM | POA: Diagnosis not present

## 2022-09-28 DIAGNOSIS — H33011 Retinal detachment with single break, right eye: Secondary | ICD-10-CM | POA: Diagnosis not present

## 2022-09-28 DIAGNOSIS — Z9889 Other specified postprocedural states: Secondary | ICD-10-CM | POA: Diagnosis not present

## 2022-10-22 DIAGNOSIS — Z9889 Other specified postprocedural states: Secondary | ICD-10-CM | POA: Diagnosis not present

## 2022-10-22 DIAGNOSIS — H33011 Retinal detachment with single break, right eye: Secondary | ICD-10-CM | POA: Diagnosis not present

## 2022-11-19 DIAGNOSIS — Z9889 Other specified postprocedural states: Secondary | ICD-10-CM | POA: Diagnosis not present

## 2022-11-19 DIAGNOSIS — H33011 Retinal detachment with single break, right eye: Secondary | ICD-10-CM | POA: Diagnosis not present

## 2022-11-19 LAB — HM DIABETES EYE EXAM

## 2022-11-26 ENCOUNTER — Encounter: Payer: Self-pay | Admitting: Internal Medicine

## 2022-12-15 ENCOUNTER — Other Ambulatory Visit: Payer: Self-pay | Admitting: Family Medicine

## 2022-12-15 DIAGNOSIS — I10 Essential (primary) hypertension: Secondary | ICD-10-CM

## 2023-05-11 ENCOUNTER — Encounter: Payer: BC Managed Care – PPO | Admitting: Family Medicine

## 2023-05-23 ENCOUNTER — Encounter: Payer: Self-pay | Admitting: Family Medicine

## 2023-05-23 ENCOUNTER — Ambulatory Visit (INDEPENDENT_AMBULATORY_CARE_PROVIDER_SITE_OTHER): Payer: BC Managed Care – PPO | Admitting: Family Medicine

## 2023-05-23 VITALS — BP 120/72 | HR 71 | Ht 67.0 in | Wt 191.0 lb

## 2023-05-23 DIAGNOSIS — Z23 Encounter for immunization: Secondary | ICD-10-CM

## 2023-05-23 DIAGNOSIS — I10 Essential (primary) hypertension: Secondary | ICD-10-CM

## 2023-05-23 DIAGNOSIS — E785 Hyperlipidemia, unspecified: Secondary | ICD-10-CM

## 2023-05-23 DIAGNOSIS — F17200 Nicotine dependence, unspecified, uncomplicated: Secondary | ICD-10-CM

## 2023-05-23 DIAGNOSIS — K219 Gastro-esophageal reflux disease without esophagitis: Secondary | ICD-10-CM

## 2023-05-23 DIAGNOSIS — M1990 Unspecified osteoarthritis, unspecified site: Secondary | ICD-10-CM

## 2023-05-23 DIAGNOSIS — Z9889 Other specified postprocedural states: Secondary | ICD-10-CM

## 2023-05-23 DIAGNOSIS — Z8669 Personal history of other diseases of the nervous system and sense organs: Secondary | ICD-10-CM

## 2023-05-23 DIAGNOSIS — Z0000 Encounter for general adult medical examination without abnormal findings: Secondary | ICD-10-CM | POA: Diagnosis not present

## 2023-05-23 DIAGNOSIS — J301 Allergic rhinitis due to pollen: Secondary | ICD-10-CM

## 2023-05-23 DIAGNOSIS — D126 Benign neoplasm of colon, unspecified: Secondary | ICD-10-CM

## 2023-05-23 MED ORDER — BISOPROLOL-HYDROCHLOROTHIAZIDE 10-6.25 MG PO TABS: 1.0000 | ORAL_TABLET | Freq: Every day | ORAL | 3 refills | Status: DC

## 2023-05-23 MED ORDER — ATORVASTATIN CALCIUM 20 MG PO TABS: 20.0000 mg | ORAL_TABLET | Freq: Every day | ORAL | 3 refills | Status: DC

## 2023-05-23 NOTE — Progress Notes (Signed)
   Subjective:    Patient ID: Michael [name], male    DOB: 09/20/1965, 57 y.o.   MRN: [id number]  HPI He is here for complete examination.  He does have underlying allergies and has some under good control at the present time.  Also he does occasionally take ibuprofen for arthritic symptoms.  He uses a PPI on an as-needed basis for his reflux symptoms.  He has had previous neck surgery and does have intermittent difficulty with that.  He continues to smoke and is not presently interested in quitting.  Does have a history of colonic polyps and is scheduled for routine follow-up concerning that next year.  He has started to note some slight difficulty with erectile dysfunction having 2 episodes of unable to maintain an erection but at this point not interested in trying any new medications.   Review of Systems  All other systems reviewed and are negative. Family and social history as well as health maintenance and immunizations was reviewed     Objective:    Physical Exam Alert and in no distress. Tympanic membranes and canals are normal. Pharyngeal area is normal. Neck is supple without adenopathy or thyromegaly. Cardiac exam shows a regular sinus rhythm without murmurs or gallops. Lungs are clear to auscultation.        Assessment & Plan:  Routine general medical examination at a health care facility - Plan: CBC with Differential/Platelet, Comprehensive metabolic panel, Lipid panel  Seasonal allergic rhinitis due to pollen  Arthritis  Gastroesophageal reflux disease without esophagitis  History of cervical discectomy  Hyperlipidemia with target LDL less than 100 - Plan: Lipid panel, atorvastatin (LIPITOR) 20 MG tablet  Hypertension, unspecified type - Plan: CBC with Differential/Platelet, Comprehensive metabolic panel, bisoprolol-hydrochlorothiazide (ZIAC) 10-6.25 MG tablet  Smoker  Tubular adenoma of colon  Need for shingles vaccine - Plan: Varicella-zoster vaccine IM  Needs  flu shot - Plan: Flu vaccine trivalent PF, 6mos and older(Flulaval,Afluria,Fluarix,Fluzone)  Hx of detached retina repair He will continue to treat his allergies and reflux as needed.  Recommend Tylenol as his first drug of choice for his arthritic symptoms and then go to NSAID of choice.  Recommend stretching exercises for his neck.  Discussed the fact that when he is ready to quit smoking we will be happy to work with him concerning that.

## 2023-05-24 LAB — COMPREHENSIVE METABOLIC PANEL
ALT: 31 [IU]/L (ref 0–44)
AST: 21 [IU]/L (ref 0–40)
Albumin: 4.3 g/dL (ref 3.8–4.9)
Alkaline Phosphatase: 134 [IU]/L — ABNORMAL HIGH (ref 44–121)
BUN/Creatinine Ratio: 16 (ref 9–20)
BUN: 17 mg/dL (ref 6–24)
Bilirubin Total: 0.8 mg/dL (ref 0.0–1.2)
CO2: 21 mmol/L (ref 20–29)
Calcium: 9.2 mg/dL (ref 8.7–10.2)
Chloride: 102 mmol/L (ref 96–106)
Creatinine, Ser: 1.04 mg/dL (ref 0.76–1.27)
Globulin, Total: 2.6 g/dL (ref 1.5–4.5)
Glucose: 95 mg/dL (ref 70–99)
Potassium: 4.4 mmol/L (ref 3.5–5.2)
Sodium: 139 mmol/L (ref 134–144)
Total Protein: 6.9 g/dL (ref 6.0–8.5)
eGFR: 84 mL/min/{1.73_m2} (ref >59)

## 2023-05-24 LAB — LIPID PANEL
Chol/HDL Ratio: 4.5 {ratio} (ref 0.0–5.0)
Cholesterol, Total: 170 mg/dL (ref 100–199)
HDL: 38 mg/dL — ABNORMAL LOW (ref >39)
LDL Chol Calc (NIH): 78 mg/dL (ref 0–99)
Triglycerides: 337 mg/dL — ABNORMAL HIGH (ref 0–149)
VLDL Cholesterol Cal: 54 mg/dL — ABNORMAL HIGH (ref 5–40)

## 2023-05-24 LAB — CBC WITH DIFFERENTIAL/PLATELET
Basophils Absolute: 0.1 10*3/uL (ref 0.0–0.2)
Basos: 1 %
EOS (ABSOLUTE): 0.2 10*3/uL (ref 0.0–0.4)
Eos: 2 %
Hematocrit: 49 % (ref 37.5–51.0)
Hemoglobin: 16.9 g/dL (ref 13.0–17.7)
Immature Grans (Abs): 0 10*3/uL (ref 0.0–0.1)
Immature Granulocytes: 0 %
Lymphocytes Absolute: 1.9 10*3/uL (ref 0.7–3.1)
Lymphs: 23 %
MCH: 32 pg (ref 26.6–33.0)
MCHC: 34.5 g/dL (ref 31.5–35.7)
MCV: 93 fL (ref 79–97)
Monocytes Absolute: 0.5 10*3/uL (ref 0.1–0.9)
Monocytes: 6 %
Neutrophils Absolute: 5.5 10*3/uL (ref 1.4–7.0)
Neutrophils: 68 %
Platelets: 211 10*3/uL (ref 150–450)
RBC: 5.28 x10E6/uL (ref 4.14–5.80)
RDW: 12.7 % (ref 11.6–15.4)
WBC: 8.2 10*3/uL (ref 3.4–10.8)

## 2023-08-23 ENCOUNTER — Encounter: Payer: Self-pay | Admitting: Internal Medicine

## 2024-04-25 ENCOUNTER — Ambulatory Visit (INDEPENDENT_AMBULATORY_CARE_PROVIDER_SITE_OTHER): Payer: PRIVATE HEALTH INSURANCE | Admitting: Family Medicine

## 2024-04-25 ENCOUNTER — Encounter: Payer: Self-pay | Admitting: Family Medicine

## 2024-04-25 VITALS — BP 136/78 | HR 76 | Ht 67.0 in | Wt 188.0 lb

## 2024-04-25 DIAGNOSIS — Z8669 Personal history of other diseases of the nervous system and sense organs: Secondary | ICD-10-CM

## 2024-04-25 DIAGNOSIS — Z9889 Other specified postprocedural states: Secondary | ICD-10-CM

## 2024-04-25 DIAGNOSIS — I10 Essential (primary) hypertension: Secondary | ICD-10-CM | POA: Diagnosis not present

## 2024-04-25 DIAGNOSIS — E785 Hyperlipidemia, unspecified: Secondary | ICD-10-CM

## 2024-04-25 DIAGNOSIS — Z23 Encounter for immunization: Secondary | ICD-10-CM

## 2024-04-25 DIAGNOSIS — J301 Allergic rhinitis due to pollen: Secondary | ICD-10-CM

## 2024-04-25 DIAGNOSIS — M461 Sacroiliitis, not elsewhere classified: Secondary | ICD-10-CM | POA: Diagnosis not present

## 2024-04-25 DIAGNOSIS — L821 Other seborrheic keratosis: Secondary | ICD-10-CM

## 2024-04-25 DIAGNOSIS — K219 Gastro-esophageal reflux disease without esophagitis: Secondary | ICD-10-CM

## 2024-04-25 DIAGNOSIS — F17200 Nicotine dependence, unspecified, uncomplicated: Secondary | ICD-10-CM

## 2024-04-25 DIAGNOSIS — D126 Benign neoplasm of colon, unspecified: Secondary | ICD-10-CM

## 2024-04-25 LAB — LIPID PANEL

## 2024-04-25 MED ORDER — BISOPROLOL-HYDROCHLOROTHIAZIDE 10-6.25 MG PO TABS: 1.0000 | ORAL_TABLET | Freq: Every day | ORAL | 3 refills | Status: AC

## 2024-04-25 MED ORDER — ATORVASTATIN CALCIUM 20 MG PO TABS: 20.0000 mg | ORAL_TABLET | Freq: Every day | ORAL | 3 refills | Status: DC

## 2024-04-25 NOTE — Progress Notes (Unsigned)
 Subjective:    Patient ID: Michael [name], male    DOB: 1966-05-21, 58 y.o.   MRN: [id number]  Discussed the use of AI scribe software for clinical note transcription with the patient, who gave verbal consent to proceed.  History of Present Illness   Michael [name] is a 58 year old male who presents with left hip pain.  He has been experiencing left hip pain for almost a week, which began suddenly upon waking. The pain is described as 'awful' and is alleviated somewhat by movement. Ibuprofen provides partial relief, but the pain persists daily, especially in the mornings. It worsens with walking and standing, causing significant discomfort when standing up. No previous similar episodes of hip pain, although he has experienced back soreness in the past.  He has a history of neck surgery and experiences intermittent neck pain, which he manages with ibuprofen. He takes ibuprofen two to three days a week, depending on the severity of the pain, and finds it helpful.  He experiences occasional acid reflux, which he manages effectively with Pepcid or a liquid antacid.  He has arthritis, primarily affecting his knuckles, with symptoms of soreness and morning stiffness. He manages this with Tylenol, which he finds effective.  He has a history of retina repair and follows up with his regular eye doctor. He received new glasses about six months ago but reports not seeing well out of the repaired eye.  He is on medication for blood pressure, specifically Ziac    He has a history of colon polyps and has scheduled a follow-up colonoscopy for November 11, as five polyps were found during the previous examination.  He mentions a spot on his back that seems to be getting bigger, although he cannot see it well. He is concerned about its appearance but does not report any associated symptoms.           Review of Systems     Objective:    Physical Exam Physical Exam   MUSCULOSKELETAL: Tenderness over  the left sacroiliac joint.  FABER test is positive.  Normal hip motion.  Negative straight leg raising. SKIN: Skin lesion with well-demarcated borders and consistent color, not concerning for melanoma.            Assessment & Plan:  Assessment and Plan    Sacroiliitis, left side Acute left sacroiliac joint pain likely due to sacroiliac joint irritation. No sciatica or nerve impingement. - Apply heat to the back for 20 minutes three times daily. - Perform gentle stretching exercises post-heat application. - Take two Aleve twice daily. - Consider two Tylenol four times daily in addition to Aleve. - Report if pain persists or worsens. - Consider stronger analgesics if current regimen is ineffective. - Consider imaging if no improvement.  Chronic neck pain, post-surgical Intermittent post-surgical neck pain managed with ibuprofen. Inconsistent physical therapy adherence. - Take ibuprofen as needed, up to 800 mg three times daily. - Perform regular range of motion exercises and physical therapy.  Osteoarthritis of hands Chronic osteoarthritis with soreness and morning stiffness.  Essential hypertension Hypertension managed with Ziac  and Bystolic.  Hyperlipidemia  Gastroesophageal reflux disease (GERD) Occasional acid reflux managed with Pepcid and liquid antacids.  Nicotine dependence Continued smoking without a cessation plan.  Retinal detachment, status post repair, with residual visual impairment Post-repair with residual visual impairment. Last ophthalmology follow-up six months ago.  Colonic polyps, pending surveillance colonoscopy Previous finding of five polyps. Surveillance colonoscopy scheduled for November 11.  Seborrheic  keratosis.  No intervention needed. General Health Maintenance Due for flu and shingles vaccinations. Blood work to be performed during this visit. - Administer flu vaccine. - Administer second shingles vaccine. - Perform blood work.

## 2024-04-25 NOTE — Patient Instructions (Signed)
 Heat to your back for 20 minutes 3 times per day and gentle stretching after that ganglion this and like this and then rotating after you do the heat yeah also take 2 Aleve twice per day.  You can also take 2 Tylenol 4 times per day as well as the Aleve

## 2024-04-26 ENCOUNTER — Other Ambulatory Visit: Payer: Self-pay | Admitting: Family Medicine

## 2024-04-26 ENCOUNTER — Ambulatory Visit: Payer: Self-pay | Admitting: Family Medicine

## 2024-04-26 DIAGNOSIS — I10 Essential (primary) hypertension: Secondary | ICD-10-CM

## 2024-04-26 DIAGNOSIS — M461 Sacroiliitis, not elsewhere classified: Secondary | ICD-10-CM

## 2024-04-26 LAB — LIPID PANEL
Cholesterol, Total: 165 mg/dL (ref 100–199)
HDL: 39 mg/dL — AB (ref >39)
LDL CALC COMMENT:: 4.2 ratio (ref 0.0–5.0)
LDL Chol Calc (NIH): 67 mg/dL (ref 0–99)
Triglycerides: 381 mg/dL — AB (ref 0–149)
VLDL Cholesterol Cal: 59 mg/dL — AB (ref 5–40)

## 2024-04-26 LAB — COMPREHENSIVE METABOLIC PANEL WITH GFR
ALT: 28 IU/L (ref 0–44)
AST: 21 IU/L (ref 0–40)
Albumin: 4.7 g/dL (ref 3.8–4.9)
Alkaline Phosphatase: 121 IU/L (ref 47–123)
BUN/Creatinine Ratio: 16 (ref 9–20)
BUN: 17 mg/dL (ref 6–24)
Bilirubin Total: 1 mg/dL (ref 0.0–1.2)
CO2: 25 mmol/L (ref 20–29)
Calcium: 9.7 mg/dL (ref 8.7–10.2)
Chloride: 100 mmol/L (ref 96–106)
Creatinine, Ser: 1.04 mg/dL (ref 0.76–1.27)
Globulin, Total: 2.6 g/dL (ref 1.5–4.5)
Glucose: 93 mg/dL (ref 70–99)
Potassium: 4.4 mmol/L (ref 3.5–5.2)
Sodium: 139 mmol/L (ref 134–144)
Total Protein: 7.3 g/dL (ref 6.0–8.5)
eGFR: 84 mL/min/1.73 (ref >59)

## 2024-04-26 LAB — CBC WITH DIFFERENTIAL/PLATELET
Basophils Absolute: 0.1 x10E3/uL (ref 0.0–0.2)
Basos: 1 %
EOS (ABSOLUTE): 0.1 x10E3/uL (ref 0.0–0.4)
Eos: 2 %
Hematocrit: 52.1 % — ABNORMAL HIGH (ref 37.5–51.0)
Hemoglobin: 17.7 g/dL (ref 13.0–17.7)
Immature Grans (Abs): 0 x10E3/uL (ref 0.0–0.1)
Immature Granulocytes: 0 %
Lymphocytes Absolute: 1.7 x10E3/uL (ref 0.7–3.1)
Lymphs: 21 %
MCH: 32.3 pg (ref 26.6–33.0)
MCHC: 34 g/dL (ref 31.5–35.7)
MCV: 95 fL (ref 79–97)
Monocytes Absolute: 0.5 x10E3/uL (ref 0.1–0.9)
Monocytes: 7 %
Neutrophils Absolute: 5.4 x10E3/uL (ref 1.4–7.0)
Neutrophils: 69 %
Platelets: 195 x10E3/uL (ref 150–450)
RBC: 5.48 x10E6/uL (ref 4.14–5.80)
RDW: 12.7 % (ref 11.6–15.4)
WBC: 7.9 x10E3/uL (ref 3.4–10.8)

## 2024-04-26 MED ORDER — TRAMADOL HCL 50 MG PO TABS: 50.0000 mg | ORAL_TABLET | Freq: Three times a day (TID) | ORAL | 0 refills | Status: DC | PRN

## 2024-04-30 NOTE — Telephone Encounter (Signed)
 Pt seeing Dr Vita tomorrow.

## 2024-05-01 ENCOUNTER — Encounter: Payer: Self-pay | Admitting: Family Medicine

## 2024-05-01 ENCOUNTER — Ambulatory Visit: Payer: PRIVATE HEALTH INSURANCE | Admitting: Family Medicine

## 2024-05-01 VITALS — BP 120/70 | HR 82

## 2024-05-01 DIAGNOSIS — M545 Low back pain, unspecified: Secondary | ICD-10-CM | POA: Diagnosis not present

## 2024-05-01 DIAGNOSIS — M5432 Sciatica, left side: Secondary | ICD-10-CM

## 2024-05-01 DIAGNOSIS — M461 Sacroiliitis, not elsewhere classified: Secondary | ICD-10-CM

## 2024-05-01 MED ORDER — MELOXICAM 15 MG PO TABS: 15.0000 mg | ORAL_TABLET | Freq: Every day | ORAL | 0 refills | Status: DC

## 2024-05-01 MED ORDER — TRAMADOL HCL 50 MG PO TABS: 100.0000 mg | ORAL_TABLET | Freq: Three times a day (TID) | ORAL | 0 refills | Status: AC | PRN

## 2024-05-01 MED ORDER — METHYLPREDNISOLONE 4 MG PO TBPK: ORAL_TABLET | ORAL | 0 refills | Status: AC

## 2024-05-01 NOTE — Patient Instructions (Signed)
 Please go get your xrays done at [facility]. You do not need to make an appointment. You can just show up.   Address: [address] [place], [place], [place] [address]

## 2024-05-01 NOTE — Progress Notes (Signed)
   Name: Michael [name]   Date of Visit: 05/01/24   Date of last visit with me: Visit date not found   CHIEF COMPLAINT:  Chief Complaint  Patient presents with   Acute Visit    Still in pain, not able to lay down left side right above buttocks, shoots down leg if standing up or laying down. Sitting down just some discomfort but worse when standing. Pain level 4 when sitting, pain level 6-9 when walking       HPI:  Discussed the use of AI scribe software for clinical note transcription with the patient, who gave verbal consent to proceed.  History of Present Illness Michael [name] is a 58 year old male who presents with severe back pain and sciatica.  He has been experiencing severe back pain for approximately two and a half weeks. The pain is most intense in the lower back and radiates into the hip and buttock area on one side. It is described as a pressure-like sensation and is exacerbated by standing while relieved by sitting.  He has a history of lifting objects, which previously caused temporary back discomfort that resolved with ibuprofen. However, this episode is more persistent and severe, significantly impacting his daily activities, including an inability to work since last Wednesday.  He is currently taking 50 mg of tramadol every eight hours, which reduces his pain from a 'nine plus' to about a 'seven'. Despite this, the relief is short-lived, and he feels the need to take it more frequently than prescribed.  He has a history of a pinched nerve in his neck, for which he previously underwent an MRI. He relates the current pain to that past experience, noting that the arm pain was similarly intense.  No numbness or different sensations are noted when comparing both sides of the affected area.     OBJECTIVE:       05/23/2023    2:05 PM  Depression screen PHQ 2/9  Decreased Interest 0  Down, Depressed, Hopeless 0  PHQ - 2 Score 0     BP Readings from Last 3 Encounters:   05/01/24 120/70  04/25/24 136/78  05/23/23 120/72    BP 120/70   Pulse 82    Physical Exam MUSCULOSKELETAL: Mild discomfort on palpation of lower back. No signs of SI joint inflammation. Straight leg test negative. NEUROLOGICAL: Sensation intact bilaterally.  Physical Exam  ASSESSMENT/PLAN:   Assessment & Plan Lumbar pain  Sciatica of left side  Sacroiliitis    Assessment and Plan Assessment & Plan Low back pain with left-sided sciatica and sacroiliitis Chronic low back pain with acute exacerbation, likely due to nerve impingement and inflammation. Differential includes sciatica and sacroiliitis. - Prescribed steroid pack with usage instructions. - Prescribed meloxicam daily for two weeks, then as needed. - Continue tramadol as needed, may increase to 100 mg every eight hours. - Ordered lumbar x-rays to evaluate further imaging needs, possible MRI. - Provided one-week work excuse. - Scheduled follow-up in three to four weeks to assess progress and MRI necessity.     Rasheen Schewe A. Vita MD [facility]

## 2024-05-07 ENCOUNTER — Ambulatory Visit
Admission: RE | Admit: 2024-05-07 | Discharge: 2024-05-07 | Disposition: A | Discharge status: Home / Self Care | Payer: PRIVATE HEALTH INSURANCE | Source: Ambulatory Visit | Attending: Family Medicine | Admitting: Family Medicine

## 2024-05-07 DIAGNOSIS — M545 Low back pain, unspecified: Secondary | ICD-10-CM

## 2024-05-07 DIAGNOSIS — M5432 Sciatica, left side: Secondary | ICD-10-CM

## 2024-05-11 MED ORDER — TRAMADOL HCL 50 MG PO TABS: 50.0000 mg | ORAL_TABLET | Freq: Three times a day (TID) | ORAL | 0 refills | Status: AC | PRN

## 2024-05-29 ENCOUNTER — Ambulatory Visit: Payer: PRIVATE HEALTH INSURANCE | Admitting: Family Medicine

## 2024-05-30 ENCOUNTER — Encounter: Payer: BC Managed Care – PPO | Admitting: Family Medicine

## 2024-05-31 ENCOUNTER — Ambulatory Visit: Payer: PRIVATE HEALTH INSURANCE | Admitting: Family Medicine

## 2024-05-31 ENCOUNTER — Encounter: Payer: Self-pay | Admitting: Family Medicine

## 2024-05-31 VITALS — BP 130/80 | HR 67 | Wt 185.2 lb

## 2024-05-31 DIAGNOSIS — M5432 Sciatica, left side: Secondary | ICD-10-CM

## 2024-05-31 DIAGNOSIS — Z0000 Encounter for general adult medical examination without abnormal findings: Secondary | ICD-10-CM

## 2024-05-31 DIAGNOSIS — E785 Hyperlipidemia, unspecified: Secondary | ICD-10-CM

## 2024-05-31 DIAGNOSIS — Z716 Tobacco abuse counseling: Secondary | ICD-10-CM

## 2024-05-31 DIAGNOSIS — M545 Low back pain, unspecified: Secondary | ICD-10-CM

## 2024-05-31 MED ORDER — MELOXICAM 15 MG PO TABS: 15.0000 mg | ORAL_TABLET | Freq: Every day | ORAL | 0 refills | Status: AC

## 2024-05-31 MED ORDER — ATORVASTATIN CALCIUM 40 MG PO TABS: 40.0000 mg | ORAL_TABLET | Freq: Every day | ORAL | 1 refills | Status: AC

## 2024-05-31 NOTE — Progress Notes (Signed)
 Name: Maynard A Hasley   Date of Visit: 05/31/24   Date of last visit with me: 05/01/2024   CHIEF COMPLAINT:  Chief Complaint  Patient presents with   Annual Exam    Cpe and 4 week follow up on back pain. Pain is getting better.       HPI:  Discussed the use of AI scribe software for clinical note transcription with the patient, who gave verbal consent to proceed.  History of Present Illness   Eryn A Galindo is a 58 year old male with degenerative disc disease who presents with back pain.  He has been experiencing persistent back pain over the past few weeks, which has shown minimal improvement. The pain is described as 'ten out of ten' and radiates down his leg, similar to previous neck pain that radiated to his arm. Sitting alleviates the pain, while standing exacerbates it. He has a history of a slipped disc and has previously experienced sciatica-like symptoms.  He is currently taking Advil dual action for pain management, consuming two tablets three times a day, totaling six tablets daily. The pain worsens if he misses a dose. He has also been prescribed meloxicam  in the past for stronger pain relief.  His past medical history includes degenerative disc disease, scoliosis, and bone spurs, which were identified in previous imaging studies. These conditions have been longstanding and minimally impactful until recently.  He has a history of elevated cholesterol levels and is currently taking Lipitor 20 mg daily. He acknowledges a diet high in carbohydrates and a lack of physical exercise, which he attributes to his back pain.  He smokes about half a pack per day and has a history of quitting for a year and a half in his thirties. He has had to postpone a scheduled colonoscopy due to his current back issues and has lost time at work, limiting his ability to take additional time off.         OBJECTIVE:       05/31/2024    3:28 PM  Depression screen PHQ 2/9  Decreased Interest 0   Down, Depressed, Hopeless 0  PHQ - 2 Score 0     BP Readings from Last 3 Encounters:  05/31/24 130/80  05/01/24 120/70  04/25/24 136/78    BP 130/80   Pulse 67   Wt 185 lb 3.2 oz (84 kg)   SpO2 98%   BMI 29.01 kg/m    Physical Exam   MUSCULOSKELETAL: No sciatica with movement.      Physical Exam Constitutional:      Appearance: Normal appearance.  Neurological:     General: No focal deficit present.     Mental Status: He is alert and oriented to person, place, and time. Mental status is at baseline.     ASSESSMENT/PLAN:   Assessment & Plan Lumbar pain  Sciatica of left side  Annual physical exam  Hyperlipidemia with target LDL less than 100  Encounter for smoking cessation counseling    Assessment and Plan    Adult Wellness Visit Routine wellness visit with slightly elevated hematocrit likely from previous steroid use and elevated triglycerides possibly due to fasting. Colonoscopy postponed due to work constraints. - Continue current health maintenance and screenings. - Postponed colonoscopy until next year. -Comprehensive annual physical exam completed today. Reviewed interval history, current medical issues, medications, allergies, and preventive care needs. Addressed all patient questions and concerns. Discussed lifestyle factors including diet, exercise, sleep, and stress management. Reviewed recommended age-appropriate  screenings, labs, and vaccinations. Counseling provided on healthy habits and routine health maintenance. Follow-up as indicated based on findings and results.  Low back pain with lumbar degenerative disc disease Chronic low back pain with minimal improvement. Possible nerve compression due to degenerative changes. MRI needed to assess status and guide treatment. Discussed potential steroid injection if MRI indicates nerve compression. - Ordered MRI of lumbar spine at [facility]. - Prescribed meloxicam  for pain management. -  Consider steroid injection if MRI indicates nerve compression.  Hyperlipidemia Slightly elevated triglycerides. Discussed increasing Lipitor to 40 mg for additional cholesterol reduction with same side effect profile. He prefers dose increase before adding second medication. - Increased Lipitor to 40 mg daily. - Instructed to take two 20 mg tablets until new prescription is filled.  Tobacco use disorder Continues to smoke half a pack per day. Discussed smoking's impact on degenerative disc disease and surgical complications. Encouraged gradual reduction to aid cessation. - Encouraged gradual reduction in smoking, aiming for cessation.      Smoking/Tobacco Cessation Counseling Yurem A Moffa is a current user of tobacco or nicotine products. He is considering quitting at this time. Counseling provided today addressed the risks of continued use and the benefits of cessation. Discussed tobacco/nicotine use history, readiness to quit, and evidence-based treatment options including behavioral strategies, support resources, and pharmacologic therapies. Provided encouragement and educational materials on steps and resources to quit smoking. Patient questions were addressed, and follow-up recommended for continued support. Total time spent on counseling: 6 minutes.     Bryton Romagnoli A. Vita MD [facility]

## 2024-07-13 ENCOUNTER — Other Ambulatory Visit: Payer: Self-pay

## 2024-07-13 DIAGNOSIS — M545 Low back pain, unspecified: Secondary | ICD-10-CM

## 2024-07-13 DIAGNOSIS — M5432 Sciatica, left side: Secondary | ICD-10-CM

## 2024-07-25 ENCOUNTER — Other Ambulatory Visit: Payer: Self-pay | Admitting: Family Medicine

## 2024-07-25 DIAGNOSIS — E785 Hyperlipidemia, unspecified: Secondary | ICD-10-CM

## 2025-05-02 ENCOUNTER — Encounter: Payer: PRIVATE HEALTH INSURANCE | Admitting: Family Medicine

## 2025-06-05 ENCOUNTER — Encounter: Payer: Self-pay | Admitting: Medical
